# Patient Record
Sex: Female | Born: 1985 | Race: Black or African American | Hispanic: No | Marital: Married | Smoking: Former smoker
Health system: Southern US, Community
[De-identification: ages and names within clinical notes are randomized; demographics above are authoritative.]

## PROBLEM LIST (undated history)

## (undated) ENCOUNTER — Inpatient Hospital Stay (HOSPITAL_COMMUNITY): Payer: Self-pay

## (undated) DIAGNOSIS — E669 Obesity, unspecified: Secondary | ICD-10-CM

## (undated) DIAGNOSIS — O139 Gestational [pregnancy-induced] hypertension without significant proteinuria, unspecified trimester: Secondary | ICD-10-CM

## (undated) DIAGNOSIS — J45909 Unspecified asthma, uncomplicated: Secondary | ICD-10-CM

## (undated) DIAGNOSIS — R51 Headache: Secondary | ICD-10-CM

## (undated) DIAGNOSIS — IMO0002 Reserved for concepts with insufficient information to code with codable children: Secondary | ICD-10-CM

## (undated) DIAGNOSIS — M549 Dorsalgia, unspecified: Secondary | ICD-10-CM

## (undated) DIAGNOSIS — B999 Unspecified infectious disease: Secondary | ICD-10-CM

## (undated) HISTORY — PX: TONSILLECTOMY: SUR1361

## (undated) HISTORY — PX: TONSILECTOMY, ADENOIDECTOMY, BILATERAL MYRINGOTOMY AND TUBES: SHX2538

## (undated) HISTORY — PX: WISDOM TOOTH EXTRACTION: SHX21

---

## 2006-02-04 ENCOUNTER — Emergency Department (HOSPITAL_COMMUNITY): Admission: EM | Admit: 2006-02-04 | Discharge: 2006-02-04 | Payer: Self-pay | Admitting: Emergency Medicine

## 2006-02-06 ENCOUNTER — Emergency Department (HOSPITAL_COMMUNITY): Admission: EM | Admit: 2006-02-06 | Discharge: 2006-02-06 | Payer: Self-pay | Admitting: Family Medicine

## 2006-08-20 ENCOUNTER — Inpatient Hospital Stay (HOSPITAL_COMMUNITY): Admission: AD | Admit: 2006-08-20 | Discharge: 2006-08-20 | Payer: Self-pay | Admitting: Obstetrics & Gynecology

## 2006-08-23 ENCOUNTER — Inpatient Hospital Stay (HOSPITAL_COMMUNITY): Admission: AD | Admit: 2006-08-23 | Discharge: 2006-08-23 | Payer: Self-pay | Admitting: Obstetrics & Gynecology

## 2006-09-03 ENCOUNTER — Inpatient Hospital Stay (HOSPITAL_COMMUNITY): Admission: AD | Admit: 2006-09-03 | Discharge: 2006-09-03 | Payer: Self-pay | Admitting: Obstetrics & Gynecology

## 2006-09-19 ENCOUNTER — Inpatient Hospital Stay (HOSPITAL_COMMUNITY): Admission: AD | Admit: 2006-09-19 | Discharge: 2006-09-19 | Payer: Self-pay | Admitting: Obstetrics & Gynecology

## 2006-10-14 ENCOUNTER — Inpatient Hospital Stay (HOSPITAL_COMMUNITY): Admission: AD | Admit: 2006-10-14 | Discharge: 2006-10-15 | Payer: Self-pay | Admitting: Obstetrics & Gynecology

## 2006-10-18 ENCOUNTER — Inpatient Hospital Stay (HOSPITAL_COMMUNITY): Admission: AD | Admit: 2006-10-18 | Discharge: 2006-10-18 | Payer: Self-pay | Admitting: Gynecology

## 2006-10-18 ENCOUNTER — Ambulatory Visit: Payer: Self-pay | Admitting: Gynecology

## 2006-10-20 ENCOUNTER — Ambulatory Visit: Payer: Self-pay | Admitting: Family Medicine

## 2006-10-26 ENCOUNTER — Ambulatory Visit (HOSPITAL_COMMUNITY): Admission: RE | Admit: 2006-10-26 | Discharge: 2006-10-26 | Payer: Self-pay | Admitting: Family Medicine

## 2006-11-16 ENCOUNTER — Ambulatory Visit (HOSPITAL_COMMUNITY): Admission: RE | Admit: 2006-11-16 | Discharge: 2006-11-16 | Payer: Self-pay | Admitting: Family Medicine

## 2006-11-29 ENCOUNTER — Ambulatory Visit (HOSPITAL_COMMUNITY): Admission: RE | Admit: 2006-11-29 | Discharge: 2006-11-29 | Payer: Self-pay | Admitting: Family Medicine

## 2006-12-01 ENCOUNTER — Emergency Department (HOSPITAL_COMMUNITY): Admission: EM | Admit: 2006-12-01 | Discharge: 2006-12-02 | Payer: Self-pay | Admitting: Emergency Medicine

## 2007-01-23 ENCOUNTER — Ambulatory Visit: Payer: Self-pay | Admitting: Obstetrics and Gynecology

## 2007-01-23 ENCOUNTER — Inpatient Hospital Stay (HOSPITAL_COMMUNITY): Admission: AD | Admit: 2007-01-23 | Discharge: 2007-01-23 | Payer: Self-pay | Admitting: Family Medicine

## 2007-02-05 ENCOUNTER — Ambulatory Visit (HOSPITAL_COMMUNITY): Admission: RE | Admit: 2007-02-05 | Discharge: 2007-02-05 | Payer: Self-pay | Admitting: Family Medicine

## 2007-02-26 ENCOUNTER — Ambulatory Visit: Payer: Self-pay | Admitting: Physician Assistant

## 2007-02-26 ENCOUNTER — Inpatient Hospital Stay (HOSPITAL_COMMUNITY): Admission: AD | Admit: 2007-02-26 | Discharge: 2007-02-26 | Payer: Self-pay | Admitting: Obstetrics & Gynecology

## 2007-03-12 ENCOUNTER — Ambulatory Visit (HOSPITAL_COMMUNITY): Admission: RE | Admit: 2007-03-12 | Discharge: 2007-03-12 | Payer: Self-pay | Admitting: Obstetrics & Gynecology

## 2007-03-15 ENCOUNTER — Inpatient Hospital Stay (HOSPITAL_COMMUNITY): Admission: AD | Admit: 2007-03-15 | Discharge: 2007-03-15 | Payer: Self-pay | Admitting: Obstetrics & Gynecology

## 2007-03-15 ENCOUNTER — Encounter: Payer: Self-pay | Admitting: Emergency Medicine

## 2007-04-17 ENCOUNTER — Inpatient Hospital Stay (HOSPITAL_COMMUNITY): Admission: AD | Admit: 2007-04-17 | Discharge: 2007-04-17 | Payer: Self-pay | Admitting: Obstetrics & Gynecology

## 2007-04-23 ENCOUNTER — Inpatient Hospital Stay (HOSPITAL_COMMUNITY): Admission: AD | Admit: 2007-04-23 | Discharge: 2007-04-23 | Payer: Self-pay | Admitting: Family Medicine

## 2007-04-23 ENCOUNTER — Ambulatory Visit: Payer: Self-pay | Admitting: Obstetrics and Gynecology

## 2007-04-28 ENCOUNTER — Inpatient Hospital Stay (HOSPITAL_COMMUNITY): Admission: AD | Admit: 2007-04-28 | Discharge: 2007-04-28 | Payer: Self-pay | Admitting: Obstetrics & Gynecology

## 2007-04-28 ENCOUNTER — Ambulatory Visit: Payer: Self-pay | Admitting: Obstetrics and Gynecology

## 2007-04-28 ENCOUNTER — Inpatient Hospital Stay (HOSPITAL_COMMUNITY): Admission: AD | Admit: 2007-04-28 | Discharge: 2007-04-29 | Payer: Self-pay | Admitting: Obstetrics & Gynecology

## 2007-04-29 ENCOUNTER — Inpatient Hospital Stay (HOSPITAL_COMMUNITY): Admission: AD | Admit: 2007-04-29 | Discharge: 2007-05-01 | Payer: Self-pay | Admitting: Obstetrics & Gynecology

## 2007-04-29 ENCOUNTER — Ambulatory Visit: Payer: Self-pay | Admitting: Obstetrics and Gynecology

## 2007-09-14 ENCOUNTER — Emergency Department (HOSPITAL_COMMUNITY): Admission: EM | Admit: 2007-09-14 | Discharge: 2007-09-14 | Payer: Self-pay | Admitting: Emergency Medicine

## 2008-01-10 ENCOUNTER — Ambulatory Visit (HOSPITAL_COMMUNITY): Admission: RE | Admit: 2008-01-10 | Discharge: 2008-01-10 | Payer: Self-pay | Admitting: Obstetrics & Gynecology

## 2008-01-28 ENCOUNTER — Ambulatory Visit (HOSPITAL_COMMUNITY): Admission: RE | Admit: 2008-01-28 | Discharge: 2008-01-28 | Payer: Self-pay | Admitting: Family Medicine

## 2008-02-11 ENCOUNTER — Inpatient Hospital Stay (HOSPITAL_COMMUNITY): Admission: AD | Admit: 2008-02-11 | Discharge: 2008-02-11 | Payer: Self-pay | Admitting: Family Medicine

## 2008-02-25 ENCOUNTER — Ambulatory Visit (HOSPITAL_COMMUNITY): Admission: RE | Admit: 2008-02-25 | Discharge: 2008-02-25 | Payer: Self-pay | Admitting: Family Medicine

## 2008-03-11 ENCOUNTER — Ambulatory Visit (HOSPITAL_COMMUNITY): Admission: RE | Admit: 2008-03-11 | Discharge: 2008-03-11 | Payer: Self-pay | Admitting: Family Medicine

## 2008-04-07 ENCOUNTER — Ambulatory Visit (HOSPITAL_COMMUNITY): Admission: RE | Admit: 2008-04-07 | Discharge: 2008-04-07 | Payer: Self-pay | Admitting: Family Medicine

## 2008-05-06 ENCOUNTER — Ambulatory Visit (HOSPITAL_COMMUNITY): Admission: RE | Admit: 2008-05-06 | Discharge: 2008-05-06 | Payer: Self-pay | Admitting: Family Medicine

## 2008-05-28 ENCOUNTER — Inpatient Hospital Stay (HOSPITAL_COMMUNITY): Admission: AD | Admit: 2008-05-28 | Discharge: 2008-05-28 | Payer: Self-pay | Admitting: Obstetrics & Gynecology

## 2008-05-28 ENCOUNTER — Ambulatory Visit: Payer: Self-pay | Admitting: Family Medicine

## 2008-06-17 ENCOUNTER — Ambulatory Visit (HOSPITAL_COMMUNITY): Admission: RE | Admit: 2008-06-17 | Discharge: 2008-06-17 | Payer: Self-pay | Admitting: Family Medicine

## 2008-07-17 ENCOUNTER — Ambulatory Visit (HOSPITAL_COMMUNITY): Admission: RE | Admit: 2008-07-17 | Discharge: 2008-07-17 | Payer: Self-pay | Admitting: Family Medicine

## 2008-07-24 ENCOUNTER — Inpatient Hospital Stay (HOSPITAL_COMMUNITY): Admission: AD | Admit: 2008-07-24 | Discharge: 2008-07-25 | Payer: Self-pay | Admitting: Obstetrics & Gynecology

## 2008-07-29 ENCOUNTER — Inpatient Hospital Stay (HOSPITAL_COMMUNITY): Admission: AD | Admit: 2008-07-29 | Discharge: 2008-07-30 | Payer: Self-pay | Admitting: Obstetrics & Gynecology

## 2008-08-06 ENCOUNTER — Inpatient Hospital Stay (HOSPITAL_COMMUNITY): Admission: AD | Admit: 2008-08-06 | Discharge: 2008-08-07 | Payer: Self-pay | Admitting: Obstetrics and Gynecology

## 2008-08-07 ENCOUNTER — Inpatient Hospital Stay (HOSPITAL_COMMUNITY): Admission: AD | Admit: 2008-08-07 | Discharge: 2008-08-09 | Payer: Self-pay | Admitting: Obstetrics and Gynecology

## 2008-08-07 ENCOUNTER — Ambulatory Visit: Payer: Self-pay | Admitting: Family

## 2008-09-29 ENCOUNTER — Emergency Department (HOSPITAL_COMMUNITY): Admission: EM | Admit: 2008-09-29 | Discharge: 2008-09-29 | Payer: Self-pay | Admitting: Family Medicine

## 2009-02-03 ENCOUNTER — Emergency Department (HOSPITAL_COMMUNITY): Admission: EM | Admit: 2009-02-03 | Discharge: 2009-02-03 | Payer: Self-pay | Admitting: Emergency Medicine

## 2010-07-29 ENCOUNTER — Emergency Department (HOSPITAL_COMMUNITY)
Admission: EM | Admit: 2010-07-29 | Discharge: 2010-07-29 | Payer: Self-pay | Source: Home / Self Care | Admitting: Emergency Medicine

## 2010-10-09 LAB — URINALYSIS, ROUTINE W REFLEX MICROSCOPIC
Bilirubin Urine: NEGATIVE
Glucose, UA: NEGATIVE mg/dL
Protein, ur: NEGATIVE mg/dL
pH: 6 (ref 5.0–8.0)

## 2010-10-09 LAB — WET PREP, GENITAL
Clue Cells Wet Prep HPF POC: NONE SEEN
Trich, Wet Prep: NONE SEEN

## 2010-10-09 LAB — URINE MICROSCOPIC-ADD ON

## 2010-10-09 LAB — GC/CHLAMYDIA PROBE AMP, GENITAL
Chlamydia, DNA Probe: NEGATIVE
GC Probe Amp, Genital: NEGATIVE

## 2010-10-18 LAB — CBC
HCT: 30.6 % — ABNORMAL LOW (ref 36.0–46.0)
HCT: 31.2 % — ABNORMAL LOW (ref 36.0–46.0)
Hemoglobin: 10.4 g/dL — ABNORMAL LOW (ref 12.0–15.0)
Hemoglobin: 11 g/dL — ABNORMAL LOW (ref 12.0–15.0)
Hemoglobin: 11.7 g/dL — ABNORMAL LOW (ref 12.0–15.0)
MCHC: 33.8 g/dL (ref 30.0–36.0)
MCHC: 34 g/dL (ref 30.0–36.0)
MCV: 89.4 fL (ref 78.0–100.0)
RBC: 3.5 MIL/uL — ABNORMAL LOW (ref 3.87–5.11)
RDW: 12.8 % (ref 11.5–15.5)
RDW: 12.9 % (ref 11.5–15.5)
RDW: 13.1 % (ref 11.5–15.5)

## 2010-10-18 LAB — COMPREHENSIVE METABOLIC PANEL
BUN: 1 mg/dL — ABNORMAL LOW (ref 6–23)
CO2: 23 mEq/L (ref 19–32)
Calcium: 7.9 mg/dL — ABNORMAL LOW (ref 8.4–10.5)
Creatinine, Ser: 0.5 mg/dL (ref 0.4–1.2)
GFR calc non Af Amer: >60 mL/min (ref >60)
Glucose, Bld: 96 mg/dL (ref 70–99)
Total Protein: 5.4 g/dL — ABNORMAL LOW (ref 6.0–8.3)

## 2010-10-18 LAB — CREATININE CLEARANCE, URINE, 24 HOUR
Collection Interval-CRCL: 24 hours
Creatinine Clearance: 207 mL/min — ABNORMAL HIGH (ref 75–115)
Creatinine, 24H Ur: 1463 mg/d (ref 700–1800)
Creatinine, Urine: 150.8 mg/dL
Urine Total Volume-CRCL: 970 mL

## 2010-10-18 LAB — DIFFERENTIAL
Basophils Absolute: 0 10*3/uL (ref 0.0–0.1)
Basophils Relative: 1 % (ref 0–1)
Eosinophils Absolute: 0 10*3/uL (ref 0.0–0.7)
Monocytes Absolute: 0.4 10*3/uL (ref 0.1–1.0)
Neutro Abs: 7 10*3/uL (ref 1.7–7.7)
Neutrophils Relative %: 86 % — ABNORMAL HIGH (ref 43–77)

## 2010-10-18 LAB — BASIC METABOLIC PANEL
CO2: 23 mEq/L (ref 19–32)
Chloride: 105 mEq/L (ref 96–112)
GFR calc Af Amer: >60 mL/min (ref >60)
Sodium: 135 mEq/L (ref 135–145)

## 2010-10-18 LAB — STREP A DNA PROBE: Group A Strep Probe: NEGATIVE

## 2010-10-18 LAB — PROTEIN, URINE, 24 HOUR: Protein, 24H Urine: 136 mg/d — ABNORMAL HIGH (ref 50–100)

## 2010-10-18 LAB — INFLUENZA A+B VIRUS AG-DIRECT(RAPID): Inflenza A Ag: NEGATIVE

## 2010-10-19 LAB — CBC
HCT: 30.5 % — ABNORMAL LOW (ref 36.0–46.0)
HCT: 34.3 % — ABNORMAL LOW (ref 36.0–46.0)
Hemoglobin: 11.7 g/dL — ABNORMAL LOW (ref 12.0–15.0)
MCHC: 34 g/dL (ref 30.0–36.0)
MCHC: 34.1 g/dL (ref 30.0–36.0)
MCV: 90.7 fL (ref 78.0–100.0)
RBC: 3.36 MIL/uL — ABNORMAL LOW (ref 3.87–5.11)
RBC: 3.82 MIL/uL — ABNORMAL LOW (ref 3.87–5.11)
RDW: 13.2 % (ref 11.5–15.5)
WBC: 7.2 10*3/uL (ref 4.0–10.5)

## 2010-11-16 NOTE — Discharge Summary (Signed)
NAMEDONNISHA, [name] NO.:  [id number]   MEDICAL RECORD NO.:  [id number]          PATIENT TYPE:  INP   LOCATION:  9156                          FACILITY:  [facility]   PHYSICIAN:  Tilda Burrow, M.D. DATE OF BIRTH:  02/23/1986   DATE OF ADMISSION:  07/29/2008  DATE OF DISCHARGE:  07/30/2008                               DISCHARGE SUMMARY   REASON FOR HOSPITALIZATION:  The patient was admitted for vomiting x2  days on July 29, 2008 and inability to eat, tolerate p.o.   PERTINENT LABORATORY DATA:  Potassium level on July 29, 2008 was 2.7,  today on the July 30, 2008, it is 2.9.  Hemoglobin at admit was 10.4.   FINAL DIAGNOSIS:  Viral illness with nausea and vomiting.   SIGNIFICANT FINDINGS:  A 24-hour urine; protein was 136, creatinine was  1.463.  The patient had elevated blood pressures on July 30, 2008.  The highest BP was 140/90.  The patient denied any PIH symptoms.   PROCEDURES PERFORMED AND TREATMENT RENDERED:  The patient was given 6  runs of potassium IV and IV fluids for rehydration   CONDITION OF THE PATIENT ON DISCHARGE:  The patient is alert and  oriented x3.  No PIH symptoms.  Fetal heart rate 120s, positive accels,  reactive strip.  The patient not vomiting x24 hours and tolerating p.o.  food and fluid.   PHYSICAL EXAMINATION:  VITAL SIGNS:  On discharge, blood pressure  137/89, pulse 86, respirations 20, and temperature 97.4.  EXTREMITIES:  Trace bilateral pedal edema __________.  LUNGS:  Clear to auscultation.  HEART:  Regular without murmurs, gallops, or rubs.  ABDOMEN:  Positive bowel sounds x4.   DISCHARGE INSTRUCTIONS:  The patient is to follow up at the health  department on the 1st for standing prenatal appointment, was to have the  potassium level rechecked at that appointment.  Prescription given today  for K-Dur 20 mEq 1 p.o. b.i.d.  We will continue until lab result is  obtained at prenatal visit.  The patient is to report  any PIH symptoms  and is to follow up for labor signs as well.      Sid Falcon, CNM      Tilda Burrow, M.D.  Electronically Signed    WM/MEDQ  D:  07/30/2008  T:  07/31/2008  Job:  [id number]

## 2010-11-16 NOTE — Discharge Summary (Signed)
NAMEJESSINA, [name] NO.:  [id number]   MEDICAL RECORD NO.:  [id number]          PATIENT TYPE:  INP   LOCATION:  9129                          [facility]ACILITY:  [facility]   PHYSICIAN:  Scheryl Darter, MD       DATE OF BIRTH:  Apr 22, 1986   DATE OF ADMISSION:  07/24/2008  DATE OF DISCHARGE:  07/25/2008                               DISCHARGE SUMMARY   PRENATAL CARE [name]:  [facility] Department.   DISCHARGE DIAGNOSIS:  Viral upper respiratory tract  infection/pharyngitis.   DISCHARGE MEDICATIONS:  1. Magic mouth wash 5 mL swish and swallow q.4-6 h as needed for pain.  2. Tamiflu 75 mg b.i.d. x5 days.  3. Tylenol 325 mg q.4 h p.r.n. pain.  4. Salt water gargle t.i.d. p.r.n., sore throat.  5. Prenatal vitamins 1 tablet daily.   PROCEDURE:  Chest x-ray shows no active cardiopulmonary disease.   LABORATORY DATA:  White count 8, hemoglobin 11, hematocrit 31.2,  platelets 148.  Rapid step screen negative.  Rapid influenza pending,  group A strep pending.   BRIEF HOSPITAL COURSE:  In brief, this is a 25 year old G3 P2 at 37  weeks presenting to the MAU with sore throat and nonproductive cough.  She was admitted for observation and evaluation with labs.  The patient  did not have any shortness of breath or no documented fevers throughout  her hospital course, and plan to discharge her today with treatment for  Tamiflu given increased risk in pregnant patient.  The patient does not  have any indication for treatment with antibiotics.  We will also  discharge her with symptomatic treatment for her sore throat.   DISCHARGE INSTRUCTIONS:  The patient instructed to return for further  evaluation if she runs a persistent fever, was unable to tolerate p.o.  liquid, or for other concerns.  The patient instructed to keep all  followup appointments at the [facility] Department.   DISCHARGE CONDITION:  The patient discharged in stable condition to  home.      Delbert Harness,  MD  Electronically Signed     ______________________________  Scheryl Darter, MD   KB/MEDQ  D:  07/25/2008  T:  07/26/2008  Job:  [id number]

## 2011-04-01 LAB — URINALYSIS, ROUTINE W REFLEX MICROSCOPIC
Bilirubin Urine: NEGATIVE
Glucose, UA: NEGATIVE
Ketones, ur: NEGATIVE
Leukocytes, UA: NEGATIVE
Protein, ur: NEGATIVE
pH: 6

## 2011-04-01 LAB — URINE MICROSCOPIC-ADD ON

## 2011-04-05 LAB — URINE MICROSCOPIC-ADD ON

## 2011-04-05 LAB — URINALYSIS, ROUTINE W REFLEX MICROSCOPIC
Glucose, UA: NEGATIVE
Nitrite: NEGATIVE
Specific Gravity, Urine: 1.015
pH: 6.5

## 2011-04-13 LAB — CBC
HCT: 36.7
Hemoglobin: 10.5 — ABNORMAL LOW
MCHC: 34.8
Platelets: 210
RBC: 3.35 — ABNORMAL LOW
RDW: 12.6
WBC: 6.8

## 2011-04-13 LAB — RPR: RPR Ser Ql: NONREACTIVE

## 2011-04-15 LAB — URINALYSIS, ROUTINE W REFLEX MICROSCOPIC
Glucose, UA: NEGATIVE
Glucose, UA: NEGATIVE
Hgb urine dipstick: NEGATIVE
Hgb urine dipstick: NEGATIVE
Specific Gravity, Urine: 1.01
Urobilinogen, UA: 0.2
pH: 6.5
pH: 7

## 2011-04-15 LAB — URINE MICROSCOPIC-ADD ON

## 2011-04-15 LAB — POCT I-STAT CREATININE
Creatinine, Ser: 0.8
Operator id: 279831

## 2011-04-15 LAB — ABO/RH: ABO/RH(D): AB POS

## 2011-04-15 LAB — I-STAT 8, (EC8 V) (CONVERTED LAB)
Acid-base deficit: 2
Bicarbonate: 21.1
HCT: 34 — ABNORMAL LOW
Operator id: 279831
TCO2: 22
pCO2, Ven: 30.9 — ABNORMAL LOW
pH, Ven: 7.442 — ABNORMAL HIGH

## 2011-04-18 LAB — URINALYSIS, ROUTINE W REFLEX MICROSCOPIC
Hgb urine dipstick: NEGATIVE
Nitrite: NEGATIVE
Protein, ur: NEGATIVE
Urobilinogen, UA: 0.2

## 2011-04-18 LAB — URINE MICROSCOPIC-ADD ON

## 2011-04-18 LAB — WET PREP, GENITAL
Trich, Wet Prep: NONE SEEN
Yeast Wet Prep HPF POC: NONE SEEN

## 2011-11-12 ENCOUNTER — Emergency Department (HOSPITAL_COMMUNITY)
Admission: EM | Admit: 2011-11-12 | Discharge: 2011-11-13 | Disposition: A | Discharge status: Home / Self Care | Payer: Self-pay | Attending: Emergency Medicine | Admitting: Emergency Medicine

## 2011-11-12 ENCOUNTER — Encounter (HOSPITAL_COMMUNITY): Payer: Self-pay | Admitting: *Deleted

## 2011-11-12 DIAGNOSIS — J329 Chronic sinusitis, unspecified: Secondary | ICD-10-CM | POA: Insufficient documentation

## 2011-11-12 DIAGNOSIS — J029 Acute pharyngitis, unspecified: Secondary | ICD-10-CM | POA: Insufficient documentation

## 2011-11-12 HISTORY — DX: Obesity, unspecified: E66.9

## 2011-11-12 MED ORDER — DEXAMETHASONE SODIUM PHOSPHATE 10 MG/ML IJ SOLN
10.0000 mg | Freq: Once | INTRAMUSCULAR | Status: AC
  Administered 2011-11-12: 10 mg via INTRAMUSCULAR
  Filled 2011-11-12 (×2): qty 1

## 2011-11-12 NOTE — ED Notes (Signed)
Pt states sore throat for 2 days. Pt also stuffy and having sinus pressure. Pt states painful to swallow. Pt able to keep fluids down.

## 2011-11-12 NOTE — ED Provider Notes (Signed)
History     CSN: [id number]  Arrival date & time 11/12/11  2219   First MD Initiated Contact with Patient 11/12/11 2251      Chief Complaint  Patient presents with  . Sore Throat    (Consider location/radiation/quality/duration/timing/severity/associated sxs/prior treatment) HPI Comments: Patient here with sore throat and sinus congestion - states husband with similar symptoms - reports no fever or chills, headache, cough or congestion - states sinus pressure and runny nose as well - is concerned that she may have strep throat.  Patient is a 26 y.o. female presenting with pharyngitis. The history is provided by the patient. No language interpreter was used.  Sore Throat This is a new problem. The current episode started yesterday. The problem occurs constantly. The problem has been unchanged. Associated symptoms include congestion and a sore throat. Pertinent negatives include no abdominal pain, anorexia, arthralgias, change in bowel habit, chest pain, chills, coughing, diaphoresis, fatigue, fever, headaches, joint swelling, myalgias, nausea, neck pain, numbness, rash, swollen glands, urinary symptoms, vertigo, visual change, vomiting or weakness. The symptoms are aggravated by swallowing. She has tried nothing for the symptoms. The treatment provided no relief.    Past Medical History  Diagnosis Date  . Obesity     Past Surgical History  Procedure Date  . Tonsilectomy, adenoidectomy, bilateral myringotomy and tubes     History reviewed. No pertinent family history.  History  Substance Use Topics  . Smoking status: Never Smoker   . Smokeless tobacco: Not on file  . Alcohol Use: No    OB History    Grav Para Term Preterm Abortions TAB SAB Ect Mult Living                  Review of Systems  Constitutional: Negative for fever, chills, diaphoresis and fatigue.  HENT: Positive for congestion and sore throat. Negative for neck pain.   Respiratory: Negative for cough.     Cardiovascular: Negative for chest pain.  Gastrointestinal: Negative for nausea, vomiting, abdominal pain, anorexia and change in bowel habit.  Musculoskeletal: Negative for myalgias, joint swelling and arthralgias.  Skin: Negative for rash.  Neurological: Negative for vertigo, weakness, numbness and headaches.  All other systems reviewed and are negative.    Allergies  Iodine and Shellfish allergy  Home Medications   Current Outpatient Rx  Name Route Sig Dispense Refill  . MENTHOL 3 MG MT LOZG Oral Take 1 lozenge by mouth every 3 (three) hours as needed. For sore throat    . ALKA-SELTZER PLUS COLD/SINUS PO Oral Take 1 tablet by mouth every 6 (six) hours as needed. For cold symptoms      BP 145/87  Pulse 100  Temp(Src) 97.8 F (36.6 C) (Oral)  Resp 16  SpO2 98%  Physical Exam  Nursing note and vitals reviewed. Constitutional: She is oriented to person, place, and time. She appears well-developed and well-nourished. No distress.  HENT:  Head: Normocephalic and atraumatic.  Right Ear: External ear normal.  Left Ear: External ear normal.  Nose: Right sinus exhibits maxillary sinus tenderness. Left sinus exhibits maxillary sinus tenderness.  Mouth/Throat: Oropharynx is clear and moist. No oropharyngeal exudate.  Eyes: Conjunctivae are normal. Pupils are equal, round, and reactive to light. No scleral icterus.  Neck: Normal range of motion. Neck supple.  Cardiovascular: Normal rate, regular rhythm and normal heart sounds.  Exam reveals no gallop and no friction rub.   No murmur heard. Pulmonary/Chest: Breath sounds normal. No respiratory distress. She has no  wheezes.  Abdominal: Soft. Bowel sounds are normal. She exhibits no distension. There is no tenderness.  Musculoskeletal: Normal range of motion. She exhibits no edema and no tenderness.  Lymphadenopathy:    She has no cervical adenopathy.  Neurological: She is alert and oriented to person, place, and time. No cranial  nerve deficit.  Skin: Skin is warm and dry. No rash noted. No erythema. No pallor.  Psychiatric: She has a normal mood and affect. Her behavior is normal. Judgment and thought content normal.    ED Course  Procedures (including critical care time)   Labs Reviewed  RAPID STREP SCREEN   No results found. Results for orders placed during the hospital encounter of 11/12/11  RAPID STREP SCREEN      Component Value Range   Streptococcus, Group A Screen (Direct) NEGATIVE  NEGATIVE    No results found.    Sinusitis     MDM  Strep negative and clinically patient with symptoms more c/w sinusitis than strep - will treat as such with decongestants - given injfection of decadron for this as well.        Izola Price [place], [place] 11/12/11 2346

## 2011-11-12 NOTE — ED Notes (Signed)
Patient with c/o sore throat for a few days.  Patient also c/o cough, nasal drainage, and cough.

## 2011-11-12 NOTE — ED Provider Notes (Signed)
Medical screening examination/treatment/procedure(s) were performed by non-physician practitioner and as supervising physician I was immediately available for consultation/collaboration.   Lyanne Co, MD 11/12/11 [phone]

## 2011-11-12 NOTE — Discharge Instructions (Signed)
Sinusitis Sinuses are air pockets within the bones of your face. The growth of bacteria within a sinus leads to infection. The infection prevents the sinuses from draining. This infection is called sinusitis. SYMPTOMS  There will be different areas of pain depending on which sinuses have become infected.  The maxillary sinuses often produce pain beneath the eyes.   Frontal sinusitis may cause pain in the middle of the forehead and above the eyes.  Other problems (symptoms) include:  Toothaches.   Colored, pus-like (purulent) drainage from the nose.   Swelling, warmth, and tenderness over the sinus areas may be signs of infection.  TREATMENT  Sinusitis is most often determined by an exam.X-rays may be taken. If x-rays have been taken, make sure you obtain your results or find out how you are to obtain them. Your caregiver may give you medications (antibiotics). These are medications that will help kill the bacteria causing the infection. You may also be given a medication (decongestant) that helps to reduce sinus swelling.  HOME CARE INSTRUCTIONS   Only take over-the-counter or prescription medicines for pain, discomfort, or fever as directed by your caregiver.   Drink extra fluids. Fluids help thin the mucus so your sinuses can drain more easily.   Applying either moist heat or ice packs to the sinus areas may help relieve discomfort.   Use saline nasal sprays to help moisten your sinuses. The sprays can be found at your local drugstore.  SEEK IMMEDIATE MEDICAL CARE IF:  You have a fever.   You have increasing pain, severe headaches, or toothache.   You have nausea, vomiting, or drowsiness.   You develop unusual swelling around the face or trouble seeing.  MAKE SURE YOU:   Understand these instructions.   Will watch your condition.   Will get help right away if you are not doing well or get worse.  Document Released: 06/20/2005 Document Revised: 06/09/2011 Document Reviewed:  01/17/2007 [facility] Patient Information 2012 [place], [place].Sinusitis Sinusitis an infection of the air pockets (sinuses) in your face. This can cause puffiness (swelling). It can also cause drainage from your sinuses.  HOME CARE   Only take medicine as told by your doctor.   Drink enough fluids to keep your pee (urine) clear or pale yellow.   Apply moist heat or ice packs for pain relief.   Use salt (saline) nose sprays. The spray will wet the thick fluid in the nose. This can help the sinuses drain.  GET HELP RIGHT AWAY IF:   You have a fever.   Your baby is older than 3 months with a rectal temperature of 102 F (38.9 C) or higher.   Your baby is 69 months old or younger with a rectal temperature of 100.4 F (38 C) or higher.   The pain gets worse.   You get a very bad headache.   You keep throwing up (vomiting).   Your face gets puffy.  MAKE SURE YOU:   Understand these instructions.   Will watch your condition.   Will get help right away if you are not doing well or get worse.  Document Released: 12/07/2007 Document Revised: 06/09/2011 Document Reviewed: 12/07/2007 [facility] Patient Information 2012 [place], [place].

## 2012-01-16 ENCOUNTER — Encounter (HOSPITAL_COMMUNITY): Payer: Self-pay | Admitting: Emergency Medicine

## 2012-01-16 ENCOUNTER — Emergency Department (HOSPITAL_COMMUNITY)
Admission: EM | Admit: 2012-01-16 | Discharge: 2012-01-16 | Disposition: A | Discharge status: Home / Self Care | Payer: Self-pay | Attending: Emergency Medicine | Admitting: Emergency Medicine

## 2012-01-16 DIAGNOSIS — B9689 Other specified bacterial agents as the cause of diseases classified elsewhere: Secondary | ICD-10-CM | POA: Insufficient documentation

## 2012-01-16 DIAGNOSIS — A499 Bacterial infection, unspecified: Secondary | ICD-10-CM | POA: Insufficient documentation

## 2012-01-16 DIAGNOSIS — N760 Acute vaginitis: Secondary | ICD-10-CM | POA: Insufficient documentation

## 2012-01-16 LAB — URINALYSIS, ROUTINE W REFLEX MICROSCOPIC
Glucose, UA: NEGATIVE mg/dL
Hgb urine dipstick: NEGATIVE
Specific Gravity, Urine: 1.008 (ref 1.005–1.030)
pH: 6 (ref 5.0–8.0)

## 2012-01-16 LAB — GC/CHLAMYDIA PROBE AMP, GENITAL: Chlamydia, DNA Probe: NEGATIVE

## 2012-01-16 LAB — URINE MICROSCOPIC-ADD ON

## 2012-01-16 LAB — POCT PREGNANCY, URINE: Preg Test, Ur: NEGATIVE

## 2012-01-16 LAB — PREGNANCY, URINE: Preg Test, Ur: NEGATIVE

## 2012-01-16 MED ORDER — METRONIDAZOLE 500 MG PO TABS: 500.0000 mg | ORAL_TABLET | Freq: Two times a day (BID) | ORAL | Status: AC

## 2012-01-16 MED ORDER — EPINEPHRINE 0.3 MG/0.3ML IJ DEVI: 0.3000 mg | Freq: Once | INTRAMUSCULAR | Status: DC

## 2012-01-16 NOTE — ED Provider Notes (Signed)
History     CSN: [id number]  Arrival date & time 01/16/12  0210   First MD Initiated Contact with [name] 01/16/12 0231      Chief Complaint  [name] presents with  . Dysuria    (Consider location/radiation/quality/duration/timing/severity/associated sxs/prior treatment) HPI Comments: [name] is a 26 yo female who presents with dysuria, frequency, and suprapubic pain of 8 hours duration. Symptoms began this evening, [name] says when [name] urinates it feels like pins stabbing her. [name] feels the urge to urinate frequently. [name] has noted a foul odor. No fever, chills, flank pain, hematuria. Pt is sexually active with husband, both reportedly monogamous, they do not use condoms or other birth control measures. No vaginal discharge.  Pt has a history of 'cervicitis' treated with a shot and pills.  Last PAP was normal    [name] is a 26 y.o. female presenting with dysuria.  Dysuria  Associated symptoms include frequency. Pertinent negatives include no chills, no nausea, no vomiting, no hematuria and no flank pain.  Dysuria  Associated symptoms include frequency. Pertinent negatives include no chills, flank pain, hematuria, nausea or vomiting.    Past Medical History  Diagnosis Date  . Obesity     Past Surgical History  Procedure Date  . Tonsilectomy, adenoidectomy, bilateral myringotomy and tubes     History reviewed. No pertinent family history.  History  Substance Use Topics  . Smoking status: Never Smoker   . Smokeless tobacco: Not on file  . Alcohol Use: No    OB History    Grav Para Term Preterm Abortions TAB SAB Ect Mult Living                  Review of Systems  Constitutional: Negative for fever and chills.  Respiratory: Negative for chest tightness.   Gastrointestinal: Negative for nausea and vomiting.  Genitourinary: Positive for dysuria and frequency. Negative for hematuria, flank pain and vaginal discharge.  Skin: Negative for rash.  All other systems  reviewed and are negative.    Allergies  Iodine and Shellfish allergy  Home Medications   Current Outpatient Rx  Name Route Sig Dispense Refill  . EPINEPHRINE 0.3 MG/0.3ML IJ DEVI Intramuscular Inject 0.3 mLs (0.3 mg total) into the muscle once. 1 Device 0  . METRONIDAZOLE 500 MG PO TABS Oral Take 1 tablet (500 mg total) by mouth 2 (two) times daily. 14 tablet 0    BP 117/85  Pulse 89  Temp 97.1 F (36.2 C)  Resp 16  SpO2 100%  Physical Exam  Constitutional: [name] is oriented to person, place, and time. [name] appears well-developed and well-nourished.  Eyes: Pupils are equal, round, and reactive to light.  Cardiovascular: Normal rate and regular rhythm.   No murmur heard. Pulmonary/Chest: Effort normal and breath sounds normal. [name] has no rales.  Abdominal: Soft. Bowel sounds are normal. [name] exhibits no distension. There is tenderness in the suprapubic area. There is no CVA tenderness.  Genitourinary: No tenderness around the vagina. Vaginal discharge found.       Small amount of grey-white discharge, faint fishy odor. No CMT. No external lesions.  Neurological: [name] is alert and oriented to person, place, and time.  Skin: Skin is warm and dry.  Psychiatric: [name] has a normal mood and affect.    ED Course  Procedures (including critical care time)  Labs Reviewed  URINALYSIS, ROUTINE W REFLEX MICROSCOPIC - Abnormal; Notable for the following:    Leukocytes, UA TRACE (*)  All other components within normal limits  URINE MICROSCOPIC-ADD ON - Abnormal; Notable for the following:    Squamous Epithelial / LPF FEW (*)     All other components within normal limits  WET PREP, GENITAL - Abnormal; Notable for the following:    Clue Cells Wet Prep HPF POC FEW (*)     WBC, Wet Prep HPF POC FEW (*)     All other components within normal limits  PREGNANCY, URINE  POCT PREGNANCY, URINE  URINALYSIS, ROUTINE W REFLEX MICROSCOPIC  GC/CHLAMYDIA PROBE AMP, GENITAL   No results  found.   1. Bacterial vaginosis       MDM   Well-appearing 26 yo F with dysuria and frequency. Hypertensive, afebrile, not tachycardic. UA not suggestive of UTI. Urine pregnancy negative. Pelvic exam with small amount of grey-white discharge, clue cells seen on wet prep. Suspect BV, will treat with Metronidazole. Gon/Chlamydia swabs sent. Will f/u on results.  [name] instructed to return to ED or seek medical attention if symptoms worsen or if signs of systemic infection arise, such as fever, chills, nausea, vomiting, change in mental status. F/u with OB/Gyn.

## 2012-01-16 NOTE — ED Notes (Signed)
Pt c/o Frequent painful burning during voiding, with abdominal pain tender to palpation. Denies vaginal discharge

## 2012-01-16 NOTE — ED Notes (Signed)
[  pt reports sudden onset of frequent urination small amounts states hx of UTI  When  pregnant

## 2012-01-16 NOTE — ED Notes (Signed)
Pt states understanding of discharge instructions 

## 2012-01-17 NOTE — ED Provider Notes (Signed)
History     CSN: [id number]  Arrival date & time 01/16/12  0210   First MD Initiated Contact with Patient 01/16/12 0231      Chief Complaint  Patient presents with  . Dysuria    (Consider location/radiation/quality/duration/timing/severity/associated sxs/prior treatment) Patient is a 26 y.o. female presenting with dysuria. The history is provided by the patient. No language interpreter was used.  Dysuria  This is a recurrent problem. The current episode started 6 to 12 hours ago. The problem occurs every urination. The problem has not changed since onset.The quality of the pain is described as burning. The pain is moderate. There has been no fever. She is sexually active. There is no history of pyelonephritis. Pertinent negatives include no vomiting, no hematuria, no possible pregnancy and no flank pain. She has tried nothing for the symptoms. Her past medical history is significant for recurrent UTIs. Her past medical history does not include kidney stones.    Past Medical History  Diagnosis Date  . Obesity     Past Surgical History  Procedure Date  . Tonsilectomy, adenoidectomy, bilateral myringotomy and tubes     History reviewed. No pertinent family history.  History  Substance Use Topics  . Smoking status: Never Smoker   . Smokeless tobacco: Not on file  . Alcohol Use: No    OB History    Grav Para Term Preterm Abortions TAB SAB Ect Mult Living                  Review of Systems  Gastrointestinal: Negative for vomiting.  Genitourinary: Positive for dysuria. Negative for hematuria and flank pain.  All other systems reviewed and are negative.    Allergies  Iodine and Shellfish allergy  Home Medications   Current Outpatient Rx  Name Route Sig Dispense Refill  . EPINEPHRINE 0.3 MG/0.3ML IJ DEVI Intramuscular Inject 0.3 mLs (0.3 mg total) into the muscle once. 1 Device 0  . METRONIDAZOLE 500 MG PO TABS Oral Take 1 tablet (500 mg total) by mouth 2 (two)  times daily. 14 tablet 0    BP 117/85  Pulse 89  Temp 97.1 F (36.2 C)  Resp 16  SpO2 100%  Physical Exam  Constitutional: She is oriented to person, place, and time. She appears well-developed and well-nourished.  HENT:  Head: Normocephalic and atraumatic.  Eyes: EOM are normal. Pupils are equal, round, and reactive to light.  Neck: Normal range of motion. Neck supple.  Cardiovascular: Normal rate and regular rhythm.   Pulmonary/Chest: Effort normal and breath sounds normal.  Abdominal: Soft. Bowel sounds are normal. There is no tenderness. There is no rebound and no guarding.  Musculoskeletal: Normal range of motion.  Neurological: She is alert and oriented to person, place, and time.  Skin: Skin is warm and dry.  Psychiatric: She has a normal mood and affect.    ED Course  Procedures (including critical care time)  Labs Reviewed  URINALYSIS, ROUTINE W REFLEX MICROSCOPIC - Abnormal; Notable for the following:    Leukocytes, UA TRACE (*)     All other components within normal limits  URINE MICROSCOPIC-ADD ON - Abnormal; Notable for the following:    Squamous Epithelial / LPF FEW (*)     All other components within normal limits  WET PREP, GENITAL - Abnormal; Notable for the following:    Clue Cells Wet Prep HPF POC FEW (*)     WBC, Wet Prep HPF POC FEW (*)     All  other components within normal limits  PREGNANCY, URINE  POCT PREGNANCY, URINE  GC/CHLAMYDIA PROBE AMP, GENITAL  LAB REPORT - SCANNED  URINALYSIS, ROUTINE W REFLEX MICROSCOPIC   No results found.   1. Bacterial vaginosis       MDM  Seen and elevated with resident agree with plan, I personally chaperoned the pelvic will treat for BV        Tiquan Bouch K [place] Freyre-Rasch, MD 01/17/12 [phone]

## 2012-01-27 NOTE — ED Provider Notes (Signed)
I saw and evaluated the patient, reviewed the resident's note and I agree with the findings and plan.   See my complete note above  Hansini Clodfelter K Itsel Opfer-Rasch, MD 01/27/12 2311

## 2012-02-01 LAB — OB RESULTS CONSOLE RUBELLA ANTIBODY, IGM: Rubella: IMMUNE

## 2012-05-28 ENCOUNTER — Inpatient Hospital Stay (HOSPITAL_COMMUNITY)
Admission: AD | Admit: 2012-05-28 | Discharge: 2012-05-29 | Disposition: A | Discharge status: Home / Self Care | Payer: Medicaid Other | Source: Ambulatory Visit | Attending: Obstetrics & Gynecology | Admitting: Obstetrics & Gynecology

## 2012-05-28 ENCOUNTER — Encounter (HOSPITAL_COMMUNITY): Payer: Self-pay | Admitting: *Deleted

## 2012-05-28 DIAGNOSIS — N390 Urinary tract infection, site not specified: Secondary | ICD-10-CM | POA: Insufficient documentation

## 2012-05-28 DIAGNOSIS — R109 Unspecified abdominal pain: Secondary | ICD-10-CM | POA: Insufficient documentation

## 2012-05-28 DIAGNOSIS — O2340 Unspecified infection of urinary tract in pregnancy, unspecified trimester: Secondary | ICD-10-CM

## 2012-05-28 DIAGNOSIS — O2390 Unspecified genitourinary tract infection in pregnancy, unspecified trimester: Secondary | ICD-10-CM | POA: Insufficient documentation

## 2012-05-28 DIAGNOSIS — Z3490 Encounter for supervision of normal pregnancy, unspecified, unspecified trimester: Secondary | ICD-10-CM

## 2012-05-28 HISTORY — DX: Unspecified asthma, uncomplicated: J45.909

## 2012-05-28 LAB — URINALYSIS, ROUTINE W REFLEX MICROSCOPIC
Glucose, UA: NEGATIVE mg/dL
Nitrite: POSITIVE — AB
Protein, ur: NEGATIVE mg/dL
pH: 5.5 (ref 5.0–8.0)

## 2012-05-28 LAB — POCT PREGNANCY, URINE: Preg Test, Ur: POSITIVE — AB

## 2012-05-28 LAB — URINE MICROSCOPIC-ADD ON

## 2012-05-28 NOTE — MAU Provider Note (Signed)
History     CSN: [id number]  Arrival date and time: 05/28/12 2132   First Provider Initiated Contact with Patient 05/28/12 2359      Chief Complaint  Patient presents with  . Abdominal Pain   HPI [name] is a 26 y.o. female who presents to MAU with abdominal pain. The pain is located in the left lower abdomen.The pain started yesterday. The pain started gradual, comes and goes and has gotten worse today.  She describes the pain as a shooting pain.  She rates the pain as 7/10. LMP 04/10/12. Last pap smear less than one year ago and was abnormal and is to have follow up in Jan. Current sex partner x 6 years. No history of STI's. The history was provided by the patient.  OB History    Grav Para Term Preterm Abortions TAB SAB Ect Mult Living   4 3 3       3       Past Medical History  Diagnosis Date  . Obesity   . Asthma     Past Surgical History  Procedure Date  . Tonsilectomy, adenoidectomy, bilateral myringotomy and tubes     History reviewed. No pertinent family history.  History  Substance Use Topics  . Smoking status: Never Smoker   . Smokeless tobacco: Not on file  . Alcohol Use: No    Allergies:  Allergies  Allergen Reactions  . Iodine Anaphylaxis  . Shellfish Allergy Anaphylaxis    Prescriptions prior to admission  Medication Sig Dispense Refill  . EPINEPHrine (EPI-PEN) 0.3 mg/0.3 mL DEVI Inject 0.3 mLs (0.3 mg total) into the muscle once.  1 Device  0    Review of Systems  Constitutional: Negative for fever, chills and weight loss.  HENT: Negative for ear pain, nosebleeds, congestion, sore throat and neck pain.   Eyes: Negative for blurred vision, double vision, photophobia and pain.  Respiratory: Negative for cough, shortness of breath and wheezing.   Cardiovascular: Negative for chest pain, palpitations and leg swelling.  Gastrointestinal: Positive for abdominal pain. Negative for heartburn, nausea, vomiting, diarrhea and constipation.    Genitourinary: Positive for urgency and frequency. Negative for dysuria.       Pressure with urination.  Musculoskeletal: Positive for back pain. Negative for myalgias.  Skin: Negative for itching and rash.  Neurological: Negative for dizziness, sensory change, speech change, seizures, weakness and headaches.  Endo/Heme/Allergies: Does not bruise/bleed easily.  Psychiatric/Behavioral: Negative for depression and substance abuse. The patient is not nervous/anxious and does not have insomnia.    Physical Exam   Blood pressure 139/82, pulse 91, temperature 97.9 F (36.6 C), temperature source Oral, resp. rate 18, height 5\' 4"  (1.626 m), weight 278 lb 12.8 oz (126.463 kg), last menstrual period 04/10/2012.  Physical Exam  Nursing note and vitals reviewed. Constitutional: She is oriented to person, place, and time. She appears well-developed and well-nourished. No distress.  HENT:  Head: Normocephalic and atraumatic.  Eyes: EOM are normal.  Neck: Neck supple.  Cardiovascular: Normal rate.   Respiratory: Effort normal.  GI: Soft. There is tenderness in the suprapubic area. There is no rigidity, no rebound, no guarding and no CVA tenderness.  Genitourinary:       External genitalia without lesions. White discharge vaginal vault. Cervix long, closed, no CMT, no adnexal tenderness. Unable to palpate uterus due to patient habitus.  Musculoskeletal: Normal range of motion.  Neurological: She is alert and oriented to person, place, and time.  Skin: Skin  is warm and dry.  Psychiatric: She has a normal mood and affect. Her behavior is normal. Judgment and thought content normal.   Results for orders placed during the hospital encounter of 05/28/12 (from the past 24 hour(s))  URINALYSIS, ROUTINE W REFLEX MICROSCOPIC     Status: Abnormal   Collection Time   05/28/12 10:47 PM      Component Value Range   Color, Urine YELLOW  YELLOW   APPearance CLEAR  CLEAR   Specific Gravity, Urine 1.025   1.005 - 1.030   pH 5.5  5.0 - 8.0   Glucose, UA NEGATIVE  NEGATIVE mg/dL   Hgb urine dipstick NEGATIVE  NEGATIVE   Bilirubin Urine NEGATIVE  NEGATIVE   Ketones, ur NEGATIVE  NEGATIVE mg/dL   Protein, ur NEGATIVE  NEGATIVE mg/dL   Urobilinogen, UA 0.2  0.0 - 1.0 mg/dL   Nitrite POSITIVE (*) NEGATIVE   Leukocytes, UA TRACE (*) NEGATIVE  URINE MICROSCOPIC-ADD ON     Status: Normal   Collection Time   05/28/12 10:47 PM      Component Value Range   Squamous Epithelial / LPF RARE  RARE   WBC, UA 0-2  <3 WBC/hpf   RBC / HPF 0-2  <3 RBC/hpf   Bacteria, UA RARE  RARE  POCT PREGNANCY, URINE     Status: Abnormal   Collection Time   05/28/12 10:53 PM      Component Value Range   Preg Test, Ur POSITIVE (*) NEGATIVE  CBC WITH DIFFERENTIAL     Status: Abnormal   Collection Time   05/29/12 12:15 AM      Component Value Range   WBC 7.5  4.0 - 10.5 K/uL   RBC 4.30  3.87 - 5.11 MIL/uL   Hemoglobin 13.0  12.0 - 15.0 g/dL   HCT 19.1  47.8 - 29.5 %   MCV 85.8  78.0 - 100.0 fL   MCH 30.2  26.0 - 34.0 pg   MCHC 35.2  30.0 - 36.0 g/dL   RDW 62.1  30.8 - 65.7 %   Platelets 212  150 - 400 K/uL   Neutrophils Relative 44  43 - 77 %   Neutro Abs 3.3  1.7 - 7.7 K/uL   Lymphocytes Relative 47 (*) 12 - 46 %   Lymphs Abs 3.5  0.7 - 4.0 K/uL   Monocytes Relative 6  3 - 12 %   Monocytes Absolute 0.5  0.1 - 1.0 K/uL   Eosinophils Relative 3  0 - 5 %   Eosinophils Absolute 0.2  0.0 - 0.7 K/uL   Basophils Relative 0  0 - 1 %   Basophils Absolute 0.0  0.0 - 0.1 K/uL  HCG, QUANTITATIVE, PREGNANCY     Status: Abnormal   Collection Time   05/29/12 12:15 AM      Component Value Range   hCG, Beta Chain, Quant, S 41069 (*) <5 mIU/mL  WET PREP, GENITAL     Status: Abnormal   Collection Time   05/29/12 12:16 AM      Component Value Range   Yeast Wet Prep HPF POC NONE SEEN  NONE SEEN   Trich, Wet Prep NONE SEEN  NONE SEEN   Clue Cells Wet Prep HPF POC NONE SEEN  NONE SEEN   WBC, Wet Prep HPF POC MODERATE  (*) NONE SEEN   MAU Course  Procedures  US Ob Comp Less 14 Wks  05/29/2012  *RADIOLOGY REPORT*  Clinical Data: Left-sided abdominal  pain.  OBSTETRIC <14 WK ULTRASOUND  Technique:  Transabdominal ultrasound was performed for evaluation of the gestation as well as the maternal uterus and adnexal regions.  Comparison:  No priors.  Intrauterine gestational sac: Single gestational sac, ovoid in shape. Yolk sac: Present. Embryo: Present. Cardiac Activity: Present. Heart Rate: 116 bpm  CRL: 4.7 mm  6 w  2 d         [place] EDC: 01/20/2013  Maternal uterus/Adnexae: The ovaries are normal in echotexture and appearance bilaterally.  No free fluid the cul-de-sac.  No findings to suggest subchorionic hemorrhage at this time.  IMPRESSION: 1.  Single viable IUP with an estimated gestational age of [redacted] weeks and 2 days, and fetal heart rate of 116 beats per minute.   Original Report Authenticated By: Trudie Reed, M.D.     Assessment: 26 y.o. female @ 6 weeks 2 days gestation with abdominal cramping   UTI in pregnancy   IUP  Plan:  Start prenatal care, return as needed   Rx Keflex   Urine sent for culture  I have reviewed this patient's vital signs, nurses notes, appropriate labs and imaging. I have discussed findings with the patient and plan of care and she voices understanding.   Medication List     As of 05/29/2012  1:36 AM    START taking these medications         cephALEXin 500 MG capsule   Commonly known as: KEFLEX   Take 1 capsule (500 mg total) by mouth 4 (four) times daily.      CONTINUE taking these medications         EPINEPHrine 0.3 mg/0.3 mL Devi   Commonly known as: EPI-PEN   Inject 0.3 mLs (0.3 mg total) into the muscle once.          Where to get your medications    These are the prescriptions that you need to pick up. We sent them to a specific pharmacy, so you will need to go there to get them.   [id number] [place],  - [address]    [address] [place] [address]    Phone: [phone]        cephALEXin 500 MG capsule            Layn Kye, RN, FNP, [facility] 05/28/2012, 11:59 PM

## 2012-05-28 NOTE — MAU Note (Signed)
Pt LMP 04/10/2012, hx irreg periods, having LLQ pain x 2 days.

## 2012-05-29 ENCOUNTER — Inpatient Hospital Stay (HOSPITAL_COMMUNITY): Payer: Medicaid Other

## 2012-05-29 LAB — CBC WITH DIFFERENTIAL/PLATELET
Eosinophils Absolute: 0.2 10*3/uL (ref 0.0–0.7)
Hemoglobin: 13 g/dL (ref 12.0–15.0)
Lymphocytes Relative: 47 % — ABNORMAL HIGH (ref 12–46)
Lymphs Abs: 3.5 10*3/uL (ref 0.7–4.0)
MCH: 30.2 pg (ref 26.0–34.0)
Monocytes Relative: 6 % (ref 3–12)
Neutrophils Relative %: 44 % (ref 43–77)
RBC: 4.3 MIL/uL (ref 3.87–5.11)
WBC: 7.5 10*3/uL (ref 4.0–10.5)

## 2012-05-29 LAB — WET PREP, GENITAL: Clue Cells Wet Prep HPF POC: NONE SEEN

## 2012-05-29 MED ORDER — CEPHALEXIN 500 MG PO CAPS: 500.0000 mg | ORAL_CAPSULE | Freq: Four times a day (QID) | ORAL | Status: DC

## 2012-05-30 NOTE — MAU Provider Note (Signed)
Attestation of Attending Supervision of Advanced Practitioner (CNM/NP): Evaluation and management procedures were performed by the Advanced Practitioner under my supervision and collaboration.  I have reviewed the Advanced Practitioner's note and chart, and I agree with the management and plan.  UGONNA  ANYANWU, MD, FACOG Attending Obstetrician & Gynecologist Faculty Practice, [facility]  

## 2012-06-05 ENCOUNTER — Encounter (HOSPITAL_COMMUNITY): Payer: Self-pay | Admitting: *Deleted

## 2012-06-05 ENCOUNTER — Inpatient Hospital Stay (HOSPITAL_COMMUNITY)
Admission: AD | Admit: 2012-06-05 | Discharge: 2012-06-05 | Disposition: A | Discharge status: Home / Self Care | Payer: Medicaid Other | Source: Ambulatory Visit | Attending: Obstetrics & Gynecology | Admitting: Obstetrics & Gynecology

## 2012-06-05 DIAGNOSIS — J209 Acute bronchitis, unspecified: Secondary | ICD-10-CM

## 2012-06-05 DIAGNOSIS — J069 Acute upper respiratory infection, unspecified: Secondary | ICD-10-CM | POA: Insufficient documentation

## 2012-06-05 DIAGNOSIS — B373 Candidiasis of vulva and vagina: Secondary | ICD-10-CM

## 2012-06-05 DIAGNOSIS — O2390 Unspecified genitourinary tract infection in pregnancy, unspecified trimester: Secondary | ICD-10-CM | POA: Insufficient documentation

## 2012-06-05 DIAGNOSIS — B3731 Acute candidiasis of vulva and vagina: Secondary | ICD-10-CM | POA: Insufficient documentation

## 2012-06-05 DIAGNOSIS — J40 Bronchitis, not specified as acute or chronic: Secondary | ICD-10-CM | POA: Insufficient documentation

## 2012-06-05 DIAGNOSIS — R079 Chest pain, unspecified: Secondary | ICD-10-CM | POA: Insufficient documentation

## 2012-06-05 DIAGNOSIS — J208 Acute bronchitis due to other specified organisms: Secondary | ICD-10-CM

## 2012-06-05 DIAGNOSIS — R05 Cough: Secondary | ICD-10-CM | POA: Insufficient documentation

## 2012-06-05 DIAGNOSIS — R059 Cough, unspecified: Secondary | ICD-10-CM | POA: Insufficient documentation

## 2012-06-05 DIAGNOSIS — B9789 Other viral agents as the cause of diseases classified elsewhere: Secondary | ICD-10-CM

## 2012-06-05 DIAGNOSIS — O99891 Other specified diseases and conditions complicating pregnancy: Secondary | ICD-10-CM | POA: Insufficient documentation

## 2012-06-05 HISTORY — DX: Dorsalgia, unspecified: M54.9

## 2012-06-05 HISTORY — DX: Unspecified infectious disease: B99.9

## 2012-06-05 HISTORY — DX: Reserved for concepts with insufficient information to code with codable children: IMO0002

## 2012-06-05 HISTORY — DX: Gestational (pregnancy-induced) hypertension without significant proteinuria, unspecified trimester: O13.9

## 2012-06-05 HISTORY — DX: Headache: R51

## 2012-06-05 MED ORDER — FLUCONAZOLE 150 MG PO TABS: 150.0000 mg | ORAL_TABLET | Freq: Once | ORAL | Status: DC

## 2012-06-05 MED ORDER — GUAIFENESIN-DM 100-10 MG/5ML PO SYRP: 5.0000 mL | ORAL_SOLUTION | Freq: Three times a day (TID) | ORAL | Status: DC | PRN

## 2012-06-05 NOTE — MAU Note (Signed)
Symptoms started on Fri, productive cough- greenish yellow with blood streaks.   Pain in upper chest when coughs- sharp- shoots across.  Fever this morning of 102

## 2012-06-05 NOTE — MAU Provider Note (Signed)
Attestation of Attending Supervision of Advanced Practitioner (CNM/NP): Evaluation and management procedures were performed by the Advanced Practitioner under my supervision and collaboration.  I have reviewed the Advanced Practitioner's note and chart, and I agree with the management and plan.  HARRAWAY-SMITH, Marisah Laker 8:47 PM

## 2012-06-05 NOTE — MAU Provider Note (Signed)
History     CSN: [id number]  Arrival date and time: 06/05/12 7829       Chief Complaint  Patient presents with  . Cough  . Fever   HPI Comments: [name] is a 26 y.o. [phone] female with an estimated gestational age of [redacted]w[redacted]d estimated by LMP of 04/10/12 with a productive cough and chest pain since Friday. Symptoms have gotten progressively worse.  Endorses a fever of 102, muscle aches and chills. Has taken Sudafed which helped the first few days, but not today. Reports nasal congestion, sore throat, voice hoarseness, greenish yellow sputum with a few instances of bright red blood streaks yesterday and today. Reports some shortness of breath at times, but not bad. Chest pain is sharp and shooting, especially with coughing.  Denies palpitations, headaches, nausea, vomiting, abdominal pain, cramping, back pain. Denies any sick contacts. Denies having this problem recently.  Patient is concerned the Keflex she is taking for her UTI is giving her a yeast infection. Endorses vaginal itchiness and white discharge for a few days. Denies vaginal bleeding or abdominal/pelvic pain.    Past Medical History  Diagnosis Date  . Obesity   . Asthma     Past Surgical History  Procedure Date  . Tonsilectomy, adenoidectomy, bilateral myringotomy and tubes     No family history on file.  History  Substance Use Topics  . Smoking status: Never Smoker   . Smokeless tobacco: Not on file  . Alcohol Use: No    Allergies:  Allergies  Allergen Reactions  . Iodine Anaphylaxis  . Shellfish Allergy Anaphylaxis    Prescriptions prior to admission  Medication Sig Dispense Refill  . cephALEXin (KEFLEX) 500 MG capsule Take 500 mg by mouth 4 (four) times daily. 7 day course, not yet completed      . Phenylephrine-DM-GG-APAP (SUDAFED PE PRESSURE+PAIN+COLD) 5-10-100-325 MG TABS Take 2 tablets by mouth every 4 (four) hours as needed. For cold symptoms      . Prenatal Vit-Fe Fumarate-FA (PRENATAL  MULTIVITAMIN) TABS Take 1 tablet by mouth daily.      Marland Kitchen EPINEPHrine (EPI-PEN) 0.3 mg/0.3 mL DEVI Inject 0.3 mLs (0.3 mg total) into the muscle once.  1 Device  0    Review of Systems  Constitutional: Positive for fever, chills and malaise/fatigue.  HENT: Positive for congestion.   Respiratory: Positive for cough, sputum production (greenish, with streaks of blood at times) and shortness of breath.   Cardiovascular: Positive for chest pain. Negative for palpitations.  Gastrointestinal: Negative for nausea, vomiting and abdominal pain.  Genitourinary:       Vaginal itch with white discharge  Musculoskeletal: Positive for myalgias.  Neurological: Negative for headaches.   Physical Exam   Blood pressure 138/87, pulse 98, temperature 98.3 F (36.8 C), temperature source Oral, height 5\' 3"  (1.6 m), weight 125.193 kg (276 lb), last menstrual period 04/10/2012, SpO2 100.00%.  Physical Exam  Nursing note and vitals reviewed. Constitutional: She is oriented to person, place, and time. She appears well-developed and well-nourished. No distress.  Cardiovascular: Normal rate, regular rhythm and normal heart sounds.        HR is high-normal at 98.  Respiratory: Effort normal. Not tachypneic. No respiratory distress. She has no decreased breath sounds. She has no wheezes. She has no rhonchi. She has no rales.  Neurological: She is alert and oriented to person, place, and time.  Skin: Skin is warm and dry.  Psychiatric: She has a normal mood and affect.  MAU Course  Procedures    Assessment and Plan  [name] is a 26 y.o. [id number] female with an estimated gestational age of [redacted]w[redacted]d by LMP of 04/10/12. 1. Viral URI with bronchitis, laryngitis  Robitussin DM for cough  Advised resting her voice, tea with honey/lemon, throat lozenges 2. Vaginal yeast infection  Diflucan     Marnee Spring 06/05/2012, 12:15 PM   Evaluation and management procedures were performed by PA-S under  my supervision/collaboration. Chart reviewed, patient examined by me and I agree with management and plan. Lungs CTAB.  Discussed flu criteria with Dr. Dolan Amen. See AVS. Follow-up Information    Follow up with [facility] [place]. In 1 week.   Contact information:   [address] [place] [address] [phone]

## 2012-07-03 ENCOUNTER — Other Ambulatory Visit (HOSPITAL_COMMUNITY): Payer: Self-pay | Admitting: Family Medicine

## 2012-07-03 ENCOUNTER — Other Ambulatory Visit: Payer: Self-pay | Admitting: Family Medicine

## 2012-07-03 DIAGNOSIS — Z3682 Encounter for antenatal screening for nuchal translucency: Secondary | ICD-10-CM

## 2012-07-03 LAB — OB RESULTS CONSOLE ANTIBODY SCREEN: Antibody Screen: NEGATIVE

## 2012-07-03 LAB — OB RESULTS CONSOLE HEPATITIS B SURFACE ANTIGEN: Hepatitis B Surface Ag: NEGATIVE

## 2012-07-03 LAB — OB RESULTS CONSOLE RUBELLA ANTIBODY, IGM: Rubella: IMMUNE

## 2012-07-03 LAB — OB RESULTS CONSOLE HIV ANTIBODY (ROUTINE TESTING)
HIV: NONREACTIVE
HIV: NONREACTIVE

## 2012-07-04 NOTE — L&D Delivery Note (Signed)
Delivery Note At 10:57 AM a viable and healthy female was delivered via Vaginal, Spontaneous Delivery (Presentation: Left Occiput Anterior).  APGAR: 8, 9; weight .   Placenta status: Intact, Spontaneous.  Cord: 3 vessels with the following complications: None.   Anesthesia: Epidural  Episiotomy: None Lacerations: 1st degree;Labial Suture Repair: n/a Est. Blood Loss (mL): 100  Mom to postpartum.  Baby to with mother.  [facility] 01/15/2013, 11:16 AM

## 2012-07-06 ENCOUNTER — Other Ambulatory Visit: Payer: Self-pay

## 2012-07-06 ENCOUNTER — Ambulatory Visit (HOSPITAL_COMMUNITY)
Admission: RE | Admit: 2012-07-06 | Discharge: 2012-07-06 | Disposition: A | Discharge status: Home / Self Care | Payer: Medicaid Other | Source: Ambulatory Visit | Attending: Family Medicine | Admitting: Family Medicine

## 2012-07-06 ENCOUNTER — Encounter (HOSPITAL_COMMUNITY): Payer: Self-pay

## 2012-07-06 DIAGNOSIS — O351XX0 Maternal care for (suspected) chromosomal abnormality in fetus, not applicable or unspecified: Secondary | ICD-10-CM | POA: Insufficient documentation

## 2012-07-06 DIAGNOSIS — O3510X0 Maternal care for (suspected) chromosomal abnormality in fetus, unspecified, not applicable or unspecified: Secondary | ICD-10-CM | POA: Insufficient documentation

## 2012-07-06 DIAGNOSIS — E669 Obesity, unspecified: Secondary | ICD-10-CM | POA: Insufficient documentation

## 2012-07-06 DIAGNOSIS — Z3682 Encounter for antenatal screening for nuchal translucency: Secondary | ICD-10-CM

## 2012-07-06 DIAGNOSIS — Z3689 Encounter for other specified antenatal screening: Secondary | ICD-10-CM | POA: Insufficient documentation

## 2012-07-06 NOTE — Progress Notes (Signed)
[name]  was seen today for an ultrasound appointment.  See full report in [facility].  Alpha Gula, MD

## 2012-07-30 ENCOUNTER — Other Ambulatory Visit (HOSPITAL_COMMUNITY): Payer: Self-pay | Admitting: Nurse Practitioner

## 2012-07-30 DIAGNOSIS — Z3689 Encounter for other specified antenatal screening: Secondary | ICD-10-CM

## 2012-08-13 ENCOUNTER — Encounter (HOSPITAL_COMMUNITY): Payer: Self-pay | Admitting: *Deleted

## 2012-08-13 ENCOUNTER — Inpatient Hospital Stay (HOSPITAL_COMMUNITY)
Admission: AD | Admit: 2012-08-13 | Discharge: 2012-08-13 | Disposition: A | Discharge status: Home / Self Care | Payer: Medicaid Other | Source: Ambulatory Visit | Attending: Obstetrics & Gynecology | Admitting: Obstetrics & Gynecology

## 2012-08-13 DIAGNOSIS — K529 Noninfective gastroenteritis and colitis, unspecified: Secondary | ICD-10-CM

## 2012-08-13 DIAGNOSIS — R109 Unspecified abdominal pain: Secondary | ICD-10-CM | POA: Insufficient documentation

## 2012-08-13 DIAGNOSIS — O99891 Other specified diseases and conditions complicating pregnancy: Secondary | ICD-10-CM | POA: Insufficient documentation

## 2012-08-13 DIAGNOSIS — K5289 Other specified noninfective gastroenteritis and colitis: Secondary | ICD-10-CM

## 2012-08-13 DIAGNOSIS — A088 Other specified intestinal infections: Secondary | ICD-10-CM | POA: Insufficient documentation

## 2012-08-13 LAB — CBC
HCT: 33.5 % — ABNORMAL LOW (ref 36.0–46.0)
Hemoglobin: 11.6 g/dL — ABNORMAL LOW (ref 12.0–15.0)
MCHC: 34.6 g/dL (ref 30.0–36.0)
RBC: 3.86 MIL/uL — ABNORMAL LOW (ref 3.87–5.11)

## 2012-08-13 LAB — URINALYSIS, ROUTINE W REFLEX MICROSCOPIC
Bilirubin Urine: NEGATIVE
Glucose, UA: NEGATIVE mg/dL
Ketones, ur: 15 mg/dL — AB
Nitrite: NEGATIVE
Specific Gravity, Urine: 1.02 (ref 1.005–1.030)
pH: 6 (ref 5.0–8.0)

## 2012-08-13 LAB — COMPREHENSIVE METABOLIC PANEL
ALT: 17 U/L (ref 0–35)
Alkaline Phosphatase: 56 U/L (ref 39–117)
BUN: 6 mg/dL (ref 6–23)
CO2: 22 mEq/L (ref 19–32)
GFR calc Af Amer: >90 mL/min (ref >90)
GFR calc non Af Amer: >90 mL/min (ref >90)
Glucose, Bld: 86 mg/dL (ref 70–99)
Potassium: 3.6 mEq/L (ref 3.5–5.1)
Sodium: 134 mEq/L — ABNORMAL LOW (ref 135–145)
Total Bilirubin: 0.4 mg/dL (ref 0.3–1.2)
Total Protein: 6.6 g/dL (ref 6.0–8.3)

## 2012-08-13 LAB — URINE MICROSCOPIC-ADD ON

## 2012-08-13 MED ORDER — ONDANSETRON 8 MG PO TBDP
8.0000 mg | ORAL_TABLET | Freq: Once | ORAL | Status: AC
  Administered 2012-08-13: 8 mg via ORAL
  Filled 2012-08-13: qty 1

## 2012-08-13 MED ORDER — FAMOTIDINE 20 MG PO TABS: 20.0000 mg | ORAL_TABLET | Freq: Two times a day (BID) | ORAL | Status: DC | PRN

## 2012-08-13 MED ORDER — ONDANSETRON HCL 4 MG PO TABS: 4.0000 mg | ORAL_TABLET | Freq: Four times a day (QID) | ORAL | Status: DC

## 2012-08-13 NOTE — MAU Provider Note (Signed)
History     CSN: [id number]  Arrival date and time: 08/13/12 1842   None     Chief Complaint  Patient presents with  . Abdominal Pain   HPI [name] is a 27 y.o. female @ [redacted]w[redacted]d gestation who presents to MAU with abdominal pain. This is a new problem. The pain started approximately 6 am.  The pain is located over the entire abdomen.  She describes the pain as cramping that comes and goes.  She rates the pain as 9/10 at its worst and 6/10 currently.  She denies vaginal bleeding or leaking of fluid. Vomited x 4 earlier today. Continues to have nausea. Prenatal care at @ [facility]. The history was provided by the patient.  OB History   Grav Para Term Preterm Abortions TAB SAB Ect Mult Living   4 3 3       3       Past Medical History  Diagnosis Date  . Obesity   . Asthma   . Headache   . Pregnancy induced hypertension   . Infection     Urinary tract infection  . Back pain     herniated disk  . Abnormal Pap smear     follow up ok    Past Surgical History  Procedure Laterality Date  . Tonsilectomy, adenoidectomy, bilateral myringotomy and tubes      Family History  Problem Relation Age of Onset  . Other Neg Hx     History  Substance Use Topics  . Smoking status: Former Smoker    Types: Cigarettes  . Smokeless tobacco: Never Used     Comment: quit with pos preg  . Alcohol Use: No    Allergies:  Allergies  Allergen Reactions  . Iodine Anaphylaxis  . Shellfish Allergy Anaphylaxis    Prescriptions prior to admission  Medication Sig Dispense Refill  . acetaminophen (TYLENOL) 325 MG tablet Take 650 mg by mouth daily as needed for pain.      . Prenatal Vit-Fe Fumarate-FA (PRENATAL MULTIVITAMIN) TABS Take 1 tablet by mouth daily.      Marland Kitchen EPINEPHrine (EPI-PEN) 0.3 mg/0.3 mL DEVI Inject 0.3 mLs (0.3 mg total) into the muscle once.  1 Device  0    Review of Systems  Constitutional: Negative for fever, chills and weight loss.  HENT: Negative for ear  pain, nosebleeds, congestion, sore throat and neck pain.   Eyes: Negative for blurred vision, double vision, photophobia and pain.  Respiratory: Negative for cough, shortness of breath and wheezing.   Cardiovascular: Negative for chest pain, palpitations and leg swelling.  Gastrointestinal: Positive for nausea, vomiting and abdominal pain. Negative for heartburn, diarrhea and constipation.  Genitourinary: Negative for dysuria, urgency and frequency.  Musculoskeletal: Negative for myalgias and back pain.  Skin: Negative for itching and rash.  Neurological: Positive for dizziness. Negative for sensory change, speech change, seizures, weakness and headaches.  Endo/Heme/Allergies: Does not bruise/bleed easily.  Psychiatric/Behavioral: Negative for depression and substance abuse. The patient is not nervous/anxious and does not have insomnia.    Blood pressure 117/69, pulse 101, temperature 98 F (36.7 C), temperature source Oral, resp. rate 20, height 5\' 2"  (1.575 m), weight 277 lb (125.646 kg), last menstrual period 04/10/2012, SpO2 100.00%.  Physical Exam  Nursing note and vitals reviewed. Constitutional: She is oriented to person, place, and time. She appears well-developed and well-nourished. No distress.  HENT:  Head: Normocephalic and atraumatic.  Eyes: EOM are normal.  Neck: Neck supple.  Cardiovascular:  Normal rate.   Respiratory: Effort normal.  GI: Soft. There is tenderness in the epigastric area. There is no rebound and no guarding.  Positive fetal heart tones.  Musculoskeletal: Normal range of motion.  Neurological: She is alert and oriented to person, place, and time.  Skin: Skin is warm and dry.  Psychiatric: She has a normal mood and affect. Her behavior is normal. Judgment and thought content normal.   Procedures  Labs pending Care turned over to Joseph Berkshire, PAC @ 20:00  Freddi Starr., RN, FNP, [facility] 08/13/2012, 8:36 PM   0200 - Assumed care from [facility],  FNP Lab results pending   2006 - patient given ODT Zofran - patient reports nausea is much improved; patient able to take in PO water here  Results for orders placed during the hospital encounter of 08/13/12 (from the past 24 hour(s))  URINALYSIS, ROUTINE W REFLEX MICROSCOPIC     Status: Abnormal   Collection Time    08/13/12  7:00 PM      Result Value Range   Color, Urine YELLOW  YELLOW   APPearance CLEAR  CLEAR   Specific Gravity, Urine 1.020  1.005 - 1.030   pH 6.0  5.0 - 8.0   Glucose, UA NEGATIVE  NEGATIVE mg/dL   Hgb urine dipstick NEGATIVE  NEGATIVE   Bilirubin Urine NEGATIVE  NEGATIVE   Ketones, ur 15 (*) NEGATIVE mg/dL   Protein, ur NEGATIVE  NEGATIVE mg/dL   Urobilinogen, UA 4.0 (*) 0.0 - 1.0 mg/dL   Nitrite NEGATIVE  NEGATIVE   Leukocytes, UA SMALL (*) NEGATIVE  URINE MICROSCOPIC-ADD ON     Status: Abnormal   Collection Time    08/13/12  7:00 PM      Result Value Range   Squamous Epithelial / LPF FEW (*) RARE   WBC, UA 3-6  <3 WBC/hpf   RBC / HPF 3-6  <3 RBC/hpf   Bacteria, UA FEW (*) RARE  CBC     Status: Abnormal   Collection Time    08/13/12  7:48 PM      Result Value Range   WBC 4.7  4.0 - 10.5 K/uL   RBC 3.86 (*) 3.87 - 5.11 MIL/uL   Hemoglobin 11.6 (*) 12.0 - 15.0 g/dL   HCT 16.1 (*) 09.6 - 04.5 %   MCV 86.8  78.0 - 100.0 fL   MCH 30.1  26.0 - 34.0 pg   MCHC 34.6  30.0 - 36.0 g/dL   RDW 40.9  81.1 - 91.4 %   Platelets 169  150 - 400 K/uL  COMPREHENSIVE METABOLIC PANEL     Status: Abnormal   Collection Time    08/13/12  7:48 PM      Result Value Range   Sodium 134 (*) 135 - 145 mEq/L   Potassium 3.6  3.5 - 5.1 mEq/L   Chloride 100  96 - 112 mEq/L   CO2 22  19 - 32 mEq/L   Glucose, Bld 86  70 - 99 mg/dL   BUN 6  6 - 23 mg/dL   Creatinine, Ser 7.82  0.50 - 1.10 mg/dL   Calcium 8.8  8.4 - 95.6 mg/dL   Total Protein 6.6  6.0 - 8.3 g/dL   Albumin 3.0 (*) 3.5 - 5.2 g/dL   AST 16  0 - 37 U/L   ALT 17  0 - 35 U/L   Alkaline Phosphatase 56  39 - 117  U/L   Total Bilirubin 0.4  0.3 - 1.2 mg/dL   GFR calc non Af Amer >90  >90 mL/min   GFR calc Af Amer >90  >90 mL/min    A: Viral gastroenteritis in pregnancy  P: Discharge home Rx for Zofran and Pepcid sent to patient's pharmacy Patient will keep follow-up at [facility] as scheduled Encouraged increased PO hydration as tolerated Patient may return to MAU as needed or if her condition should change or worsen  Freddi Starr, PA-C 08/13/2012 8:37 PM

## 2012-08-13 NOTE — MAU Note (Signed)
Patient states she started having upper abdominal pain and has vomited x 4 today. Denies bleeding or leaking.

## 2012-08-15 ENCOUNTER — Ambulatory Visit (HOSPITAL_COMMUNITY)
Admission: RE | Admit: 2012-08-15 | Discharge: 2012-08-15 | Disposition: A | Discharge status: Home / Self Care | Payer: Medicaid Other | Source: Ambulatory Visit | Attending: Nurse Practitioner | Admitting: Nurse Practitioner

## 2012-08-15 DIAGNOSIS — O99210 Obesity complicating pregnancy, unspecified trimester: Secondary | ICD-10-CM | POA: Insufficient documentation

## 2012-08-15 DIAGNOSIS — O09299 Supervision of pregnancy with other poor reproductive or obstetric history, unspecified trimester: Secondary | ICD-10-CM | POA: Insufficient documentation

## 2012-08-15 DIAGNOSIS — O3440 Maternal care for other abnormalities of cervix, unspecified trimester: Secondary | ICD-10-CM | POA: Insufficient documentation

## 2012-08-15 DIAGNOSIS — Z363 Encounter for antenatal screening for malformations: Secondary | ICD-10-CM | POA: Insufficient documentation

## 2012-08-15 DIAGNOSIS — E669 Obesity, unspecified: Secondary | ICD-10-CM | POA: Insufficient documentation

## 2012-08-15 DIAGNOSIS — Z1389 Encounter for screening for other disorder: Secondary | ICD-10-CM | POA: Insufficient documentation

## 2012-08-15 DIAGNOSIS — O358XX0 Maternal care for other (suspected) fetal abnormality and damage, not applicable or unspecified: Secondary | ICD-10-CM | POA: Insufficient documentation

## 2012-08-15 LAB — URINE CULTURE: Colony Count: >=100000

## 2012-08-16 ENCOUNTER — Inpatient Hospital Stay (HOSPITAL_COMMUNITY): Admission: RE | Admit: 2012-08-16 | Payer: Self-pay | Source: Ambulatory Visit

## 2012-08-16 ENCOUNTER — Ambulatory Visit (HOSPITAL_COMMUNITY): Admission: RE | Admit: 2012-08-16 | Payer: Medicaid Other | Source: Ambulatory Visit

## 2012-08-17 ENCOUNTER — Other Ambulatory Visit (HOSPITAL_COMMUNITY): Payer: Self-pay

## 2012-08-25 NOTE — MAU Provider Note (Signed)
Attestation of Attending Supervision of Advanced Practitioner (CNM/NP): Evaluation and management procedures were performed by the Advanced Practitioner under my supervision and collaboration. I have reviewed the Advanced Practitioner's note and chart, and I agree with the management and plan.  Zaccheaus Storlie H. 3:14 PM

## 2012-08-27 ENCOUNTER — Other Ambulatory Visit: Payer: Self-pay | Admitting: Family Medicine

## 2012-08-27 DIAGNOSIS — Z3689 Encounter for other specified antenatal screening: Secondary | ICD-10-CM

## 2012-09-13 ENCOUNTER — Ambulatory Visit (HOSPITAL_COMMUNITY)
Admission: RE | Admit: 2012-09-13 | Discharge: 2012-09-13 | Disposition: A | Discharge status: Home / Self Care | Payer: Medicaid Other | Source: Ambulatory Visit | Attending: Family Medicine | Admitting: Family Medicine

## 2012-09-13 ENCOUNTER — Other Ambulatory Visit: Payer: Self-pay | Admitting: Obstetrics & Gynecology

## 2012-09-13 DIAGNOSIS — Z0489 Encounter for examination and observation for other specified reasons: Secondary | ICD-10-CM

## 2012-09-13 DIAGNOSIS — Z3689 Encounter for other specified antenatal screening: Secondary | ICD-10-CM | POA: Insufficient documentation

## 2012-09-13 DIAGNOSIS — E669 Obesity, unspecified: Secondary | ICD-10-CM | POA: Insufficient documentation

## 2012-09-13 DIAGNOSIS — O09299 Supervision of pregnancy with other poor reproductive or obstetric history, unspecified trimester: Secondary | ICD-10-CM | POA: Insufficient documentation

## 2012-09-13 DIAGNOSIS — O3440 Maternal care for other abnormalities of cervix, unspecified trimester: Secondary | ICD-10-CM | POA: Insufficient documentation

## 2012-12-02 ENCOUNTER — Encounter (HOSPITAL_COMMUNITY): Payer: Self-pay | Admitting: Family

## 2012-12-02 ENCOUNTER — Inpatient Hospital Stay (HOSPITAL_COMMUNITY)
Admission: AD | Admit: 2012-12-02 | Discharge: 2012-12-02 | Disposition: A | Discharge status: Home / Self Care | Payer: Medicaid Other | Source: Ambulatory Visit | Attending: Obstetrics & Gynecology | Admitting: Obstetrics & Gynecology

## 2012-12-02 DIAGNOSIS — N949 Unspecified condition associated with female genital organs and menstrual cycle: Secondary | ICD-10-CM

## 2012-12-02 DIAGNOSIS — N390 Urinary tract infection, site not specified: Secondary | ICD-10-CM | POA: Insufficient documentation

## 2012-12-02 DIAGNOSIS — O2390 Unspecified genitourinary tract infection in pregnancy, unspecified trimester: Secondary | ICD-10-CM | POA: Insufficient documentation

## 2012-12-02 DIAGNOSIS — O2343 Unspecified infection of urinary tract in pregnancy, third trimester: Secondary | ICD-10-CM

## 2012-12-02 DIAGNOSIS — R109 Unspecified abdominal pain: Secondary | ICD-10-CM | POA: Insufficient documentation

## 2012-12-02 LAB — URINALYSIS, ROUTINE W REFLEX MICROSCOPIC
Glucose, UA: NEGATIVE mg/dL
Ketones, ur: NEGATIVE mg/dL
Nitrite: NEGATIVE
Protein, ur: NEGATIVE mg/dL
Urobilinogen, UA: 0.2 mg/dL (ref 0.0–1.0)

## 2012-12-02 LAB — URINE MICROSCOPIC-ADD ON

## 2012-12-02 MED ORDER — NITROFURANTOIN MONOHYD MACRO 100 MG PO CAPS: 100.0000 mg | ORAL_CAPSULE | Freq: Two times a day (BID) | ORAL | Status: DC

## 2012-12-02 NOTE — MAU Provider Note (Signed)
History     CSN: [id number]  Arrival date and time: 12/02/12 0108   First Provider Initiated Contact with Patient 12/02/12 0210      Chief Complaint  Patient presents with  . Abdominal Pain   HPI  Pt is a G4P3003 here at 33.5 wks IUP with report of lower pelvic pressure and left groin pain.  Pt reports feeling shooting pains that started around 2030 last night.  No report of vaginal bleeding or leaking of fluid.  +fetal movement.    Past Medical History  Diagnosis Date  . Obesity   . Asthma   . Headache(784.0)   . Pregnancy induced hypertension   . Infection     Urinary tract infection  . Back pain     herniated disk  . Abnormal Pap smear     follow up ok    Past Surgical History  Procedure Laterality Date  . Tonsilectomy, adenoidectomy, bilateral myringotomy and tubes      Family History  Problem Relation Age of Onset  . Other Neg Hx     History  Substance Use Topics  . Smoking status: Former Smoker    Types: Cigarettes  . Smokeless tobacco: Never Used     Comment: quit with pos preg  . Alcohol Use: No    Allergies:  Allergies  Allergen Reactions  . Iodine Anaphylaxis  . Shellfish Allergy Anaphylaxis    Prescriptions prior to admission  Medication Sig Dispense Refill  . acetaminophen (TYLENOL) 325 MG tablet Take 650 mg by mouth daily as needed for pain.      Marland Kitchen EPINEPHrine (EPI-PEN) 0.3 mg/0.3 mL DEVI Inject 0.3 mLs (0.3 mg total) into the muscle once.  1 Device  0  . famotidine (PEPCID) 20 MG tablet Take 1 tablet (20 mg total) by mouth 2 (two) times daily as needed for heartburn.  30 tablet  0  . ondansetron (ZOFRAN) 4 MG tablet Take 1 tablet (4 mg total) by mouth every 6 (six) hours.  12 tablet  0  . Prenatal Vit-Fe Fumarate-FA (PRENATAL MULTIVITAMIN) TABS Take 1 tablet by mouth daily.        Review of Systems  Genitourinary:       Pelvic pressure  Musculoskeletal: Positive for joint pain (groin pain).  All other systems reviewed and are  negative.   Physical Exam   Blood pressure 124/74, pulse 99, temperature 97.9 F (36.6 C), temperature source Oral, resp. rate 18, last menstrual period 04/10/2012, SpO2 99.00%.  Physical Exam  Constitutional: She is oriented to person, place, and time. She appears well-developed and well-nourished. No distress.  HENT:  Head: Normocephalic.  Neck: Normal range of motion. Neck supple.  Cardiovascular: Normal rate, regular rhythm and normal heart sounds.   Respiratory: Effort normal and breath sounds normal.  GI: Soft. There is no tenderness.  Genitourinary: No bleeding around the vagina. Vaginal discharge (mucusy) found.  Musculoskeletal: Normal range of motion. She exhibits edema (trace bilat pedal).  Neurological: She is alert and oriented to person, place, and time.  Skin: Skin is warm and dry.   FHR 120's, +accels  Toco - x1  MAU Course  Procedures  Results for orders placed during the hospital encounter of 12/02/12 (from the past 24 hour(s))  URINALYSIS, ROUTINE W REFLEX MICROSCOPIC     Status: Abnormal   Collection Time    12/02/12  1:52 AM      Result Value Range   Color, Urine YELLOW  YELLOW   APPearance CLEAR  CLEAR   Specific Gravity, Urine 1.005  1.005 - 1.030   pH 7.5  5.0 - 8.0   Glucose, UA NEGATIVE  NEGATIVE mg/dL   Hgb urine dipstick NEGATIVE  NEGATIVE   Bilirubin Urine NEGATIVE  NEGATIVE   Ketones, ur NEGATIVE  NEGATIVE mg/dL   Protein, ur NEGATIVE  NEGATIVE mg/dL   Urobilinogen, UA 0.2  0.0 - 1.0 mg/dL   Nitrite NEGATIVE  NEGATIVE   Leukocytes, UA MODERATE (*) NEGATIVE  URINE MICROSCOPIC-ADD ON     Status: Abnormal   Collection Time    12/02/12  1:52 AM      Result Value Range   Squamous Epithelial / LPF FEW (*) RARE   WBC, UA 7-10  <3 WBC/hpf   Bacteria, UA FEW (*) RARE     Assessment and Plan  UTI in Pregnancy Round Ligament Pain  Plan: DC to home RX Macrobid Urine Culture Keep scheduled appt with Health  Department  [facility] 12/02/2012, 2:12 AM

## 2012-12-02 NOTE — MAU Provider Note (Signed)
Attestation of Attending Supervision of Advanced Practitioner (CNM/NP): Evaluation and management procedures were performed by the Advanced Practitioner under my supervision and collaboration.  I have reviewed the Advanced Practitioner's note and chart, and I agree with the management and plan.  HARRAWAY-SMITH, Syana Degraffenreid 3:13 AM     

## 2012-12-02 NOTE — MAU Note (Signed)
Patient is in with c/o intense lower abdominal pressure, describes feeling "like baby's head in her pelvic". Patient states that she have pressure for 2 months but its worse today. She denies vaginal bleeding or lof. She reports good fetal movement

## 2012-12-03 LAB — URINE CULTURE

## 2012-12-19 LAB — OB RESULTS CONSOLE GBS: GBS: NEGATIVE

## 2012-12-20 ENCOUNTER — Encounter: Payer: Self-pay | Admitting: *Deleted

## 2013-01-14 ENCOUNTER — Inpatient Hospital Stay (HOSPITAL_COMMUNITY)
Admission: AD | Admit: 2013-01-14 | Discharge: 2013-01-15 | Disposition: A | Discharge status: Home / Self Care | Payer: Medicaid Other | Source: Ambulatory Visit | Attending: Obstetrics & Gynecology | Admitting: Obstetrics & Gynecology

## 2013-01-14 ENCOUNTER — Encounter (HOSPITAL_COMMUNITY): Payer: Self-pay | Admitting: *Deleted

## 2013-01-14 DIAGNOSIS — O479 False labor, unspecified: Secondary | ICD-10-CM | POA: Insufficient documentation

## 2013-01-14 NOTE — MAU Note (Signed)
PT SAYS UC HURT BAD AT 945.      NO VE IN HD.    DENIES HSV.    HAD MRSA- 2007-   HAD BOIT UNDER ARM IN LEFT.   HAS APPOINTMENT TOMORROW AT HD

## 2013-01-15 ENCOUNTER — Inpatient Hospital Stay (HOSPITAL_COMMUNITY)
Admission: AD | Admit: 2013-01-15 | Discharge: 2013-01-17 | DRG: 767 | Disposition: A | Discharge status: Home / Self Care | Payer: Medicaid Other | Source: Ambulatory Visit | Attending: Obstetrics & Gynecology | Admitting: Obstetrics & Gynecology

## 2013-01-15 ENCOUNTER — Encounter (HOSPITAL_COMMUNITY): Payer: Self-pay | Admitting: Anesthesiology

## 2013-01-15 ENCOUNTER — Inpatient Hospital Stay (HOSPITAL_COMMUNITY): Payer: Medicaid Other | Admitting: Anesthesiology

## 2013-01-15 ENCOUNTER — Encounter (HOSPITAL_COMMUNITY): Payer: Self-pay | Admitting: *Deleted

## 2013-01-15 DIAGNOSIS — O139 Gestational [pregnancy-induced] hypertension without significant proteinuria, unspecified trimester: Secondary | ICD-10-CM | POA: Diagnosis present

## 2013-01-15 DIAGNOSIS — O0940 Supervision of pregnancy with grand multiparity, unspecified trimester: Secondary | ICD-10-CM

## 2013-01-15 DIAGNOSIS — O133 Gestational [pregnancy-induced] hypertension without significant proteinuria, third trimester: Secondary | ICD-10-CM

## 2013-01-15 DIAGNOSIS — IMO0001 Reserved for inherently not codable concepts without codable children: Secondary | ICD-10-CM

## 2013-01-15 DIAGNOSIS — Z302 Encounter for sterilization: Secondary | ICD-10-CM

## 2013-01-15 LAB — CBC
MCH: 29.9 pg (ref 26.0–34.0)
MCHC: 35 g/dL (ref 30.0–36.0)
MCV: 85.4 fL (ref 78.0–100.0)
Platelets: 173 10*3/uL (ref 150–400)
RDW: 13.1 % (ref 11.5–15.5)

## 2013-01-15 LAB — RPR: RPR Ser Ql: NONREACTIVE

## 2013-01-15 LAB — COMPREHENSIVE METABOLIC PANEL
AST: 17 U/L (ref 0–37)
CO2: 21 mEq/L (ref 19–32)
Chloride: 101 mEq/L (ref 96–112)
Creatinine, Ser: 0.56 mg/dL (ref 0.50–1.10)
GFR calc non Af Amer: >90 mL/min (ref >90)
Glucose, Bld: 82 mg/dL (ref 70–99)
Total Bilirubin: 0.4 mg/dL (ref 0.3–1.2)

## 2013-01-15 LAB — PROTEIN / CREATININE RATIO, URINE: Total Protein, Urine: 24 mg/dL

## 2013-01-15 LAB — MRSA PCR SCREENING: MRSA by PCR: NEGATIVE

## 2013-01-15 MED ORDER — LANOLIN HYDROUS EX OINT: TOPICAL_OINTMENT | CUTANEOUS | Status: DC | PRN

## 2013-01-15 MED ORDER — BENZOCAINE-MENTHOL 20-0.5 % EX AERO: 1.0000 "application " | INHALATION_SPRAY | CUTANEOUS | Status: DC | PRN

## 2013-01-15 MED ORDER — OXYCODONE-ACETAMINOPHEN 5-325 MG PO TABS: 1.0000 | ORAL_TABLET | ORAL | Status: DC | PRN

## 2013-01-15 MED ORDER — OXYTOCIN BOLUS FROM INFUSION
500.0000 mL | INTRAVENOUS | Status: DC
  Administered 2013-01-15: 500 mL via INTRAVENOUS

## 2013-01-15 MED ORDER — ACETAMINOPHEN 325 MG PO TABS: 650.0000 mg | ORAL_TABLET | ORAL | Status: DC | PRN

## 2013-01-15 MED ORDER — LACTATED RINGERS IV SOLN: 500.0000 mL | Freq: Once | INTRAVENOUS | Status: DC

## 2013-01-15 MED ORDER — LIDOCAINE HCL (PF) 1 % IJ SOLN
30.0000 mL | INTRAMUSCULAR | Status: DC | PRN
  Filled 2013-01-15 (×2): qty 30

## 2013-01-15 MED ORDER — LABETALOL HCL 5 MG/ML IV SOLN
20.0000 mg | INTRAVENOUS | Status: DC | PRN
  Filled 2013-01-15: qty 4

## 2013-01-15 MED ORDER — CITRIC ACID-SODIUM CITRATE 334-500 MG/5ML PO SOLN: 30.0000 mL | ORAL | Status: DC | PRN

## 2013-01-15 MED ORDER — PHENYLEPHRINE 40 MCG/ML (10ML) SYRINGE FOR IV PUSH (FOR BLOOD PRESSURE SUPPORT)
80.0000 ug | PREFILLED_SYRINGE | INTRAVENOUS | Status: DC | PRN
  Administered 2013-01-15: 80 ug via INTRAVENOUS
  Filled 2013-01-15: qty 2

## 2013-01-15 MED ORDER — IBUPROFEN 600 MG PO TABS
600.0000 mg | ORAL_TABLET | Freq: Four times a day (QID) | ORAL | Status: DC | PRN
  Filled 2013-01-15: qty 1

## 2013-01-15 MED ORDER — ZOLPIDEM TARTRATE 5 MG PO TABS: 5.0000 mg | ORAL_TABLET | Freq: Every evening | ORAL | Status: DC | PRN

## 2013-01-15 MED ORDER — OXYCODONE-ACETAMINOPHEN 5-325 MG PO TABS
1.0000 | ORAL_TABLET | ORAL | Status: DC | PRN
  Administered 2013-01-15 – 2013-01-16 (×3): 1 via ORAL
  Administered 2013-01-16 (×3): 2 via ORAL
  Administered 2013-01-17 (×3): 1 via ORAL
  Filled 2013-01-15 (×2): qty 2
  Filled 2013-01-15 (×3): qty 1
  Filled 2013-01-15: qty 2
  Filled 2013-01-15: qty 1

## 2013-01-15 MED ORDER — SODIUM BICARBONATE 8.4 % IV SOLN
INTRAVENOUS | Status: DC | PRN
  Administered 2013-01-15: 5 mL via EPIDURAL

## 2013-01-15 MED ORDER — ONDANSETRON HCL 4 MG/2ML IJ SOLN: 4.0000 mg | Freq: Four times a day (QID) | INTRAMUSCULAR | Status: DC | PRN

## 2013-01-15 MED ORDER — EPINEPHRINE HCL 0.1 MG/ML IJ SOSY
0.1000 mg | PREFILLED_SYRINGE | Freq: Once | INTRAMUSCULAR | Status: DC
  Filled 2013-01-15: qty 10

## 2013-01-15 MED ORDER — WITCH HAZEL-GLYCERIN EX PADS: 1.0000 "application " | MEDICATED_PAD | CUTANEOUS | Status: DC | PRN

## 2013-01-15 MED ORDER — NALBUPHINE SYRINGE 5 MG/0.5 ML
10.0000 mg | INJECTION | INTRAMUSCULAR | Status: DC | PRN
  Administered 2013-01-15: 10 mg via INTRAVENOUS
  Filled 2013-01-15 (×2): qty 1

## 2013-01-15 MED ORDER — EPHEDRINE 5 MG/ML INJ
10.0000 mg | INTRAVENOUS | Status: DC | PRN
  Filled 2013-01-15: qty 2

## 2013-01-15 MED ORDER — SENNOSIDES-DOCUSATE SODIUM 8.6-50 MG PO TABS
2.0000 | ORAL_TABLET | Freq: Every day | ORAL | Status: DC
  Administered 2013-01-15 – 2013-01-16 (×2): 2 via ORAL

## 2013-01-15 MED ORDER — ONDANSETRON HCL 4 MG/2ML IJ SOLN: 4.0000 mg | INTRAMUSCULAR | Status: DC | PRN

## 2013-01-15 MED ORDER — ONDANSETRON HCL 4 MG PO TABS: 4.0000 mg | ORAL_TABLET | ORAL | Status: DC | PRN

## 2013-01-15 MED ORDER — TETANUS-DIPHTH-ACELL PERTUSSIS 5-2.5-18.5 LF-MCG/0.5 IM SUSP: 0.5000 mL | Freq: Once | INTRAMUSCULAR | Status: DC

## 2013-01-15 MED ORDER — FENTANYL 2.5 MCG/ML BUPIVACAINE 1/10 % EPIDURAL INFUSION (WH - ANES)
14.0000 mL/h | INTRAMUSCULAR | Status: DC | PRN
  Administered 2013-01-15: 14 mL/h via EPIDURAL
  Filled 2013-01-15: qty 125

## 2013-01-15 MED ORDER — PHENYLEPHRINE 40 MCG/ML (10ML) SYRINGE FOR IV PUSH (FOR BLOOD PRESSURE SUPPORT)
80.0000 ug | PREFILLED_SYRINGE | INTRAVENOUS | Status: DC | PRN
  Filled 2013-01-15: qty 2
  Filled 2013-01-15: qty 5

## 2013-01-15 MED ORDER — DIBUCAINE 1 % RE OINT: 1.0000 "application " | TOPICAL_OINTMENT | RECTAL | Status: DC | PRN

## 2013-01-15 MED ORDER — IBUPROFEN 600 MG PO TABS
600.0000 mg | ORAL_TABLET | Freq: Four times a day (QID) | ORAL | Status: DC
  Administered 2013-01-15 – 2013-01-17 (×7): 600 mg via ORAL
  Filled 2013-01-15 (×6): qty 1

## 2013-01-15 MED ORDER — DIPHENHYDRAMINE HCL 25 MG PO CAPS: 25.0000 mg | ORAL_CAPSULE | Freq: Four times a day (QID) | ORAL | Status: DC | PRN

## 2013-01-15 MED ORDER — EPHEDRINE 5 MG/ML INJ
10.0000 mg | INTRAVENOUS | Status: DC | PRN
  Filled 2013-01-15: qty 4
  Filled 2013-01-15: qty 2

## 2013-01-15 MED ORDER — LACTATED RINGERS IV SOLN
INTRAVENOUS | Status: DC
  Administered 2013-01-15: 05:00:00 via INTRAVENOUS

## 2013-01-15 MED ORDER — PRENATAL MULTIVITAMIN CH
1.0000 | ORAL_TABLET | Freq: Every day | ORAL | Status: DC
  Administered 2013-01-15 – 2013-01-16 (×2): 1 via ORAL
  Filled 2013-01-15 (×2): qty 1

## 2013-01-15 MED ORDER — SIMETHICONE 80 MG PO CHEW: 80.0000 mg | CHEWABLE_TABLET | ORAL | Status: DC | PRN

## 2013-01-15 MED ORDER — DIPHENHYDRAMINE HCL 50 MG/ML IJ SOLN: 12.5000 mg | INTRAMUSCULAR | Status: DC | PRN

## 2013-01-15 MED ORDER — OXYTOCIN 40 UNITS IN LACTATED RINGERS INFUSION - SIMPLE MED
62.5000 mL/h | INTRAVENOUS | Status: DC
  Filled 2013-01-15: qty 1000

## 2013-01-15 MED ORDER — LACTATED RINGERS IV SOLN
500.0000 mL | INTRAVENOUS | Status: DC | PRN
  Administered 2013-01-15: 500 mL via INTRAVENOUS

## 2013-01-15 NOTE — Progress Notes (Signed)
 Phenylephrine IVP given for low BP per MD order/protocol

## 2013-01-15 NOTE — Anesthesia Procedure Notes (Signed)
Epidural Patient location during procedure: OB  Preanesthetic Checklist Completed: patient identified, site marked, surgical consent, pre-op evaluation, timeout performed, IV checked, risks and benefits discussed and monitors and equipment checked  Epidural Patient position: sitting Prep: site prepped and draped and DuraPrep Patient monitoring: continuous pulse ox and blood pressure Approach: midline Injection technique: LOR air  Needle:  Needle type: Tuohy  Needle gauge: 17 G Needle length: 9 cm and 9 Needle insertion depth: 7 cm Catheter type: closed end flexible Catheter size: 19 Gauge Catheter at skin depth: 14 cm Test dose: negative  Assessment Events: blood not aspirated, injection not painful, no injection resistance, negative IV test and no paresthesia  Additional Notes Discussed Dura prep vs Chloroprep and Iodine allergy. I recc Duraprep and don't expect a systemic Rxn or Local as it is only ingestion that caused her problems  Dosing of Epidural:  1st dose, through catheter ............................................Marland Kitchen epi 1:200K + Xylocaine 40 mg  2nd dose, through catheter, after waiting 3 minutes...Marland KitchenMarland Kitchenepi 1:200K + Xylocaine 60 mg    ( 2% Xylo charted as a single dose in Epic Meds for ease of charting; actual dosing was fractionated as above, for saftey's sake)  As each dose occurred, patient was free of IV sx; and patient exhibited no evidence of SA injection.  Patient is more comfortable after epidural dosed. Please see RN's note for documentation of vital signs,and FHR which are stable.  Patient reminded not to try to ambulate with numb legs, and that an RN must be present when she attempts to get up.

## 2013-01-15 NOTE — H&P (Signed)
[name] is a 27 y.o. female presenting for contractions starting Sunday (2 days ago) and becoming stronger and more frequent. Seen earlier last night with contractions and same cervical exam. However, also now having elevated blood pressures.  Receives care at [facility] Department. 1 hour GTT 86, normal quad screen, normal ultrasounds. Dating by LMP (01-15-13), by 6 week ultrasound EDD 01-20-13 would make her 39.2.  Has had elevated blood pressures on several occasions during pregnancy: (135/94 on 1/27, 143/93 on 2/24, 129/90 on 5/29, 137/95 on 6/19).  Denies headache, vision changes, RUQ pain. Pt states that her BP was high when taken with automated machine and normal with manual checks.   Maternal Medical History:  Reason for admission: Contractions.   Contractions: Onset was 2 days ago.   Frequency: regular.   Perceived severity is strong.    Fetal activity: Perceived fetal activity is normal.   Last perceived fetal movement was within the past hour.    Prenatal complications: no prenatal complications Prenatal Complications - Diabetes: none.    OB History   Grav Para Term Preterm Abortions TAB SAB Ect Mult Living   4 3 3       3      Past Medical History  Diagnosis Date  . Obesity   . Asthma   . Headache(784.0)   . Pregnancy induced hypertension   . Infection     Urinary tract infection  . Back pain     herniated disk  . Abnormal Pap smear     follow up ok   Past Surgical History  Procedure Laterality Date  . Tonsilectomy, adenoidectomy, bilateral myringotomy and tubes     Family History: family history is negative for Other. Social History:  reports that she has quit smoking. Her smoking use included Cigarettes. She smoked 0.00 packs per day. She has never used smokeless tobacco. She reports that she does not drink alcohol or use illicit drugs.   Prenatal Transfer Tool  Maternal Diabetes: No Genetic Screening: Normal Maternal Ultrasounds/Referrals:  Normal Fetal Ultrasounds or other Referrals:  None Maternal Substance Abuse:  Yes:  Type: Smoker, Other: (in early pregnancy Significant Maternal Medications:  None Significant Maternal Lab Results:  Lab values include: Group B Strep negative Other Comments:  None  ROS See HPI   Exam Filed Vitals:   01/15/13 0344 01/15/13 0421  BP: 146/105 131/91  Pulse: 90 83    Physical Exam   GEN:  WNWD, no distress HEENT:  NCAT, EOMI, conjunctiva clear NECK:  Supple, non-tender, no thyromegaly, trachea midline CV: RRR, no murmur RESP:  CTAB ABD:  Soft, non-tender, no guarding or rebound, normal bowel sounds EXTREM:  Warm, well perfused, no edema or tenderness NEURO:  Alert, oriented, no focal deficits  Dilation: 1 Effacement (%): 50 Cervical Position: Posterior Station: -2 Presentation: Vertex Exam by:: DCALLAWAY, RN    Prenatal labs: ABO, Rh:   Antibody: Negative, Negative (12/31 0000) Rubella: Immune (12/31 0000) RPR: Nonreactive (12/31 0000)  HBsAg: Negative, Negative (12/31 0000)  HIV: Non-reactive, Non-reactive, Non-reactive (12/31 0000)  GBS: Negative (06/20 0000)   Assessment/Plan: 27 y.o. G4P3003 at [redacted]w[redacted]d with early labor, GHTN - Admit to L&D with GHTN given full-term and contractions - PIH labs, mag if any severe features - Will use pitocin as needed to augment labor. - GBS negative -  Epidural when desired - Anticipate SVD - Desires BTL and papers signed > 30 days ago  Napoleon Form 01/15/2013, 4:26 AM

## 2013-01-15 NOTE — MAU Note (Signed)
Worsening contractions.  

## 2013-01-15 NOTE — Anesthesia Postprocedure Evaluation (Signed)
Anesthesia Post Note  Patient: [name]  Procedure(s) Performed: * No procedures listed *  Anesthesia type: Epidural  Patient location: Mother/Baby  Post pain: Pain level controlled  Post assessment: Post-op Vital signs reviewed  Last Vitals:  Filed Vitals:   01/15/13 1315  BP: 122/78  Pulse: 86  Temp: 36.6 C  Resp: 20    Post vital signs: Reviewed  Level of consciousness:alert  Complications: No apparent anesthesia complications

## 2013-01-15 NOTE — Anesthesia Preprocedure Evaluation (Addendum)
Anesthesia Evaluation  Patient identified by MRN, date of birth, ID band Patient awake    Reviewed: Allergy & Precautions, H&P , Patient's Chart, lab work & pertinent test results  Airway Mallampati: II  TM Distance: >3 FB Neck ROM: full    Dental  (+) Teeth Intact   Pulmonary asthma ,  breath sounds clear to auscultation        Cardiovascular hypertension, Rhythm:regular Rate:Normal     Neuro/Psych    GI/Hepatic   Endo/Other  Morbid obesity  Renal/GU      Musculoskeletal   Abdominal   Peds  Hematology   Anesthesia Other Findings       Reproductive/Obstetrics (+) Pregnancy                             Anesthesia Physical Anesthesia Plan  ASA: III  Anesthesia Plan: Epidural   Post-op Pain Management:    Induction:   Airway Management Planned:   Additional Equipment:   Intra-op Plan:   Post-operative Plan:   Informed Consent: I have reviewed the patients History and Physical, chart, labs and discussed the procedure including the risks, benefits and alternatives for the proposed anesthesia with the patient or authorized representative who has indicated his/her understanding and acceptance.   Dental Advisory Given  Plan Discussed with:   Anesthesia Plan Comments: (Labs checked- platelets confirmed with RN in room. Fetal heart tracing, per RN, reported to be stable enough for sitting procedure. Discussed epidural, and patient consents to the procedure:  included risk of possible headache,backache, failed block, allergic reaction, and nerve injury. This patient was asked if she had any questions or concerns before the procedure started.)        Anesthesia Quick Evaluation  

## 2013-01-16 ENCOUNTER — Inpatient Hospital Stay (HOSPITAL_COMMUNITY): Payer: Medicaid Other | Admitting: Anesthesiology

## 2013-01-16 ENCOUNTER — Encounter (HOSPITAL_COMMUNITY)
Admission: AD | Disposition: A | Discharge status: Home / Self Care | Payer: Self-pay | Source: Ambulatory Visit | Attending: Obstetrics & Gynecology

## 2013-01-16 ENCOUNTER — Encounter (HOSPITAL_COMMUNITY): Payer: Self-pay | Admitting: Anesthesiology

## 2013-01-16 DIAGNOSIS — Z302 Encounter for sterilization: Secondary | ICD-10-CM

## 2013-01-16 HISTORY — PX: TUBAL LIGATION: SHX77

## 2013-01-16 SURGERY — LIGATION, FALLOPIAN TUBE, POSTPARTUM
Anesthesia: Epidural | Site: Abdomen | Laterality: Bilateral | Wound class: Clean

## 2013-01-16 MED ORDER — BUPIVACAINE HCL (PF) 0.25 % IJ SOLN
INTRAMUSCULAR | Status: DC | PRN
  Administered 2013-01-16: 30 mL

## 2013-01-16 MED ORDER — PROMETHAZINE HCL 25 MG/ML IJ SOLN: 6.2500 mg | INTRAMUSCULAR | Status: DC | PRN

## 2013-01-16 MED ORDER — KETOROLAC TROMETHAMINE 30 MG/ML IJ SOLN
INTRAMUSCULAR | Status: DC | PRN
  Administered 2013-01-16: 30 mg via INTRAVENOUS

## 2013-01-16 MED ORDER — LIDOCAINE-EPINEPHRINE (PF) 2 %-1:200000 IJ SOLN
INTRAMUSCULAR | Status: AC
  Filled 2013-01-16: qty 40

## 2013-01-16 MED ORDER — ONDANSETRON HCL 4 MG/2ML IJ SOLN
INTRAMUSCULAR | Status: AC
  Filled 2013-01-16: qty 2

## 2013-01-16 MED ORDER — SODIUM BICARBONATE 8.4 % IV SOLN
INTRAVENOUS | Status: AC
  Filled 2013-01-16: qty 50

## 2013-01-16 MED ORDER — FENTANYL CITRATE 0.05 MG/ML IJ SOLN
INTRAMUSCULAR | Status: DC | PRN
  Administered 2013-01-16: 50 ug via INTRAVENOUS
  Administered 2013-01-16 (×2): 25 ug via INTRAVENOUS
  Administered 2013-01-16: 50 ug via INTRAVENOUS

## 2013-01-16 MED ORDER — FENTANYL CITRATE 0.05 MG/ML IJ SOLN
INTRAMUSCULAR | Status: AC
  Filled 2013-01-16: qty 5

## 2013-01-16 MED ORDER — MIDAZOLAM HCL 2 MG/2ML IJ SOLN
INTRAMUSCULAR | Status: AC
  Filled 2013-01-16: qty 2

## 2013-01-16 MED ORDER — ONDANSETRON HCL 4 MG/2ML IJ SOLN
INTRAMUSCULAR | Status: DC | PRN
  Administered 2013-01-16: 4 mg via INTRAVENOUS

## 2013-01-16 MED ORDER — METOCLOPRAMIDE HCL 10 MG PO TABS
10.0000 mg | ORAL_TABLET | Freq: Once | ORAL | Status: AC
  Administered 2013-01-16: 10 mg via ORAL
  Filled 2013-01-16: qty 1

## 2013-01-16 MED ORDER — FENTANYL CITRATE 0.05 MG/ML IJ SOLN: 25.0000 ug | INTRAMUSCULAR | Status: DC | PRN

## 2013-01-16 MED ORDER — FAMOTIDINE 20 MG PO TABS
40.0000 mg | ORAL_TABLET | Freq: Once | ORAL | Status: AC
  Administered 2013-01-16: 40 mg via ORAL
  Filled 2013-01-16 (×2): qty 1

## 2013-01-16 MED ORDER — MIDAZOLAM HCL 2 MG/2ML IJ SOLN: 0.5000 mg | Freq: Once | INTRAMUSCULAR | Status: DC | PRN

## 2013-01-16 MED ORDER — KETOROLAC TROMETHAMINE 30 MG/ML IJ SOLN: 15.0000 mg | Freq: Once | INTRAMUSCULAR | Status: AC | PRN

## 2013-01-16 MED ORDER — MIDAZOLAM HCL 5 MG/5ML IJ SOLN
INTRAMUSCULAR | Status: DC | PRN
  Administered 2013-01-16 (×2): 1 mg via INTRAVENOUS

## 2013-01-16 MED ORDER — MEPERIDINE HCL 25 MG/ML IJ SOLN: 6.2500 mg | INTRAMUSCULAR | Status: DC | PRN

## 2013-01-16 MED ORDER — 0.9 % SODIUM CHLORIDE (POUR BTL) OPTIME
TOPICAL | Status: DC | PRN
  Administered 2013-01-16: 1000 mL

## 2013-01-16 MED ORDER — SODIUM BICARBONATE 8.4 % IV SOLN
INTRAVENOUS | Status: DC | PRN
  Administered 2013-01-16: 5 mL via EPIDURAL

## 2013-01-16 MED ORDER — LACTATED RINGERS IV SOLN
INTRAVENOUS | Status: DC
  Administered 2013-01-16: 500 mL via INTRAVENOUS
  Administered 2013-01-16: 15:00:00 via INTRAVENOUS

## 2013-01-16 MED ORDER — KETOROLAC TROMETHAMINE 30 MG/ML IJ SOLN
INTRAMUSCULAR | Status: AC
  Filled 2013-01-16: qty 1

## 2013-01-16 MED ORDER — BUPIVACAINE HCL (PF) 0.25 % IJ SOLN
INTRAMUSCULAR | Status: AC
  Filled 2013-01-16: qty 30

## 2013-01-16 SURGICAL SUPPLY — 21 items
BLADE SURG 11 STRL SS (BLADE) ×2 IMPLANT
BNDG ADH 5X3 H2O RPLNT NS (GAUZE/BANDAGES/DRESSINGS) ×1
BNDG COHESIVE 3X5 WHT NS (GAUZE/BANDAGES/DRESSINGS) ×2 IMPLANT
CHLORAPREP W/TINT 26ML (MISCELLANEOUS) ×2 IMPLANT
CLIP FILSHIE TUBAL LIGA STRL (Clip) ×2 IMPLANT
CLOTH BEACON ORANGE TIMEOUT ST (SAFETY) ×2 IMPLANT
GLOVE BIO SURGEON STRL SZ 6.5 (GLOVE) ×2 IMPLANT
GLOVE BIOGEL PI IND STRL 7.0 (GLOVE) ×1 IMPLANT
GLOVE BIOGEL PI INDICATOR 7.0 (GLOVE) ×1
GOWN PREVENTION PLUS LG XLONG (DISPOSABLE) ×4 IMPLANT
NEEDLE HYPO 25X1 1.5 SAFETY (NEEDLE) ×2 IMPLANT
NS IRRIG 1000ML POUR BTL (IV SOLUTION) ×2 IMPLANT
PACK ABDOMINAL MINOR (CUSTOM PROCEDURE TRAY) ×2 IMPLANT
SPONGE LAP 4X18 X RAY DECT (DISPOSABLE) IMPLANT
SUT VIC AB 0 CT1 27 (SUTURE) ×2
SUT VIC AB 0 CT1 27XBRD ANBCTR (SUTURE) ×1 IMPLANT
SUT VICRYL 4-0 PS2 18IN ABS (SUTURE) ×2 IMPLANT
SYR CONTROL 10ML LL (SYRINGE) ×2 IMPLANT
TOWEL OR 17X24 6PK STRL BLUE (TOWEL DISPOSABLE) ×4 IMPLANT
TRAY FOLEY BAG SILVER LF 14FR (CATHETERS) ×2 IMPLANT
WATER STERILE IRR 1000ML POUR (IV SOLUTION) ×2 IMPLANT

## 2013-01-16 NOTE — H&P (Signed)
Agree with note  Adam Phenix, MD .

## 2013-01-16 NOTE — Progress Notes (Signed)
Post Partum Day 1 Subjective: Reports back pain, she thinkd from epidural.  Objective: Blood pressure 138/88, pulse 92, temperature 98.1 F (36.7 C), temperature source Oral, resp. rate 20, height 5\' 4"  (1.626 m), weight 290 lb (131.543 kg), last menstrual period 04/10/2012, SpO2 98.00%, unknown if currently breastfeeding.  Physical Exam:  General: alert, cooperative and appears stated age Lochia: appropriate Uterine Fundus: firm DVT Evaluation: No evidence of DVT seen on physical exam.   Recent Labs  01/15/13 0510  HGB 12.1  HCT 34.6*    Assessment/Plan: Plan for discharge tomorrow and Contraception for pp BTL. Bottle feeding. Patient counseled, r.e. Risks benefits of BTL, including permanency of procedure, risk of failure(1:100), increased risk of ectopic.  Patient verbalized understanding and desires to proceed   LOS: 1 day   Holdyn Poyser S 01/16/2013, 7:15 AM

## 2013-01-16 NOTE — Progress Notes (Signed)
UR chart review completed.  

## 2013-01-16 NOTE — Anesthesia Postprocedure Evaluation (Signed)
Anesthesia Post Note  Patient: [name]  Procedure(s) Performed: Procedure(s) (LRB): POST PARTUM TUBAL LIGATION (Bilateral)  Anesthesia type: Epidural  Patient location: PACU  Post pain: Pain level controlled  Post assessment: Post-op Vital signs reviewed  Last Vitals:  Filed Vitals:   01/16/13 1530  BP: 128/76  Pulse: 96  Temp: 36.6 C  Resp: 21    Post vital signs: Reviewed  Level of consciousness: awake  Complications: No apparent anesthesia complications

## 2013-01-16 NOTE — Op Note (Signed)
[name] 01/15/2013 - 01/16/2013  PREOPERATIVE DIAGNOSIS:  Multiparity, undesired fertility  POSTOPERATIVE DIAGNOSIS:  Multiparity, undesired fertility  PROCEDURE:  Postpartum Bilateral Tubal Sterilization using Filshie Clips   ANESTHESIA:  Epidural  COMPLICATIONS:  None immediate.  ESTIMATED BLOOD LOSS:  Less than 20 ml.  FLUIDS: 500 ml LR.  URINE OUTPUT:  30 ml of clear urine.  INDICATIONS: 27 y.o. [id number]  with undesired fertility,status post vaginal delivery, desires permanent sterilization. Risks and benefits of procedure discussed with patient including permanence of method, bleeding, infection, injury to surrounding organs and need for additional procedures. Risk failure of 0.5-1% with increased risk of ectopic gestation if pregnancy occurs was also discussed with patient.   FINDINGS:  Normal uterus, tubes, and ovaries.  TECHNIQUE:  The patient was taken to the operating room where her epidural anesthesia was dosed up to surgical level and found to be adequate.  She was then placed in the dorsal supine position and prepped and draped in sterile fashion.  After an adequate timeout was performed, attention was turned to the patient's abdomen where a small transverse skin incision was made under the umbilical fold. The incision was taken down to the layer of fascia using the scalpel, and fascia was incised, and extended bilaterally using Mayo scissors. The peritoneum was entered in a sharp fashion. Attention was then turned to the patient's uterus, and left fallopian tube was identified and followed out to the fimbriated end.  A Filshie clip was placed on the left fallopian tube about 2 cm from the cornual attachment, with care given to incorporate the underlying mesosalpinx.  A similar process was carried out on the right side allowing for bilateral tubal sterilization.  Good hemostasis was noted overall.  Local analgesia was drizzled on both operative sites.The instruments were  then removed from the patient's abdomen and the fascial incision was repaired with 0 Vicryl, and the skin was closed with a 4-0 Vicryl subcuticular stitch. The patient tolerated the procedure well.  Sponge, lap, and needle counts were correct times two.  The patient was then taken to the recovery room in stable condition.  Adam Phenix, MD 01/16/2013 2:37 PM

## 2013-01-16 NOTE — Anesthesia Preprocedure Evaluation (Signed)
Anesthesia Evaluation  Patient identified by MRN, date of birth, ID band Patient awake    Reviewed: Allergy & Precautions, H&P , Patient's Chart, lab work & pertinent test results  Airway Mallampati: II TM Distance: >3 FB Neck ROM: full    Dental  (+) Teeth Intact   Pulmonary asthma ,  breath sounds clear to auscultation        Cardiovascular hypertension, Rhythm:regular Rate:Normal     Neuro/Psych  Headaches,    GI/Hepatic   Endo/Other  Morbid obesity  Renal/GU      Musculoskeletal   Abdominal   Peds  Hematology   Anesthesia Other Findings       Reproductive/Obstetrics                           Anesthesia Physical  Anesthesia Plan  ASA: III  Anesthesia Plan: Epidural   Post-op Pain Management:    Induction:   Airway Management Planned:   Additional Equipment:   Intra-op Plan:   Post-operative Plan:   Informed Consent: I have reviewed the patients History and Physical, chart, labs and discussed the procedure including the risks, benefits and alternatives for the proposed anesthesia with the patient or authorized representative who has indicated his/her understanding and acceptance.   Dental Advisory Given  Plan Discussed with:   Anesthesia Plan Comments: (Labs checked- platelets confirmed with RN in room. Fetal heart tracing, per RN, reported to be stable enough for sitting procedure. Discussed epidural, and patient consents to the procedure:  included risk of possible headache,backache, failed block, allergic reaction, and nerve injury. This patient was asked if she had any questions or concerns before the procedure started. )        Anesthesia Quick Evaluation

## 2013-01-16 NOTE — Transfer of Care (Signed)
Immediate Anesthesia Transfer of Care Note  Patient: [name]  Procedure(s) Performed: Procedure(s): POST PARTUM TUBAL LIGATION (Bilateral)  Patient Location: PACU  Anesthesia Type:Epidural  Level of Consciousness: awake, alert  and oriented  Airway & Oxygen Therapy: Patient Spontanous Breathing  Post-op Assessment: Report given to PACU RN and Post -op Vital signs reviewed and stable  Post vital signs: stable  Complications: No apparent anesthesia complications

## 2013-01-17 ENCOUNTER — Encounter (HOSPITAL_COMMUNITY): Payer: Self-pay | Admitting: Obstetrics & Gynecology

## 2013-01-17 DIAGNOSIS — O0940 Supervision of pregnancy with grand multiparity, unspecified trimester: Secondary | ICD-10-CM

## 2013-01-17 DIAGNOSIS — O139 Gestational [pregnancy-induced] hypertension without significant proteinuria, unspecified trimester: Secondary | ICD-10-CM

## 2013-01-17 MED ORDER — IBUPROFEN 600 MG PO TABS: 600.0000 mg | ORAL_TABLET | Freq: Four times a day (QID) | ORAL | Status: DC

## 2013-01-17 MED ORDER — OXYCODONE-ACETAMINOPHEN 5-325 MG PO TABS: 1.0000 | ORAL_TABLET | ORAL | Status: DC | PRN

## 2013-01-17 NOTE — Discharge Summary (Signed)
Obstetric Discharge Summary Reason for Admission: onset of labor Delivery Note On 01/15/13: At 10:57 AM a viable and healthy female was delivered via Vaginal, Spontaneous Delivery (Presentation: Left Occiput Anterior). APGAR: 8, 9; weight .  Placenta status: Intact, Spontaneous. Cord: 3 vessels with the following complications: None.  Anesthesia: Epidural  Episiotomy: None  Lacerations: 1st degree;Labial  Suture Repair: n/a  Est. Blood Loss (mL): 100  Mom to postpartum. Baby to with mother.   Prenatal Procedures: ultrasound Intrapartum Procedures: spontaneous vaginal delivery Postpartum Procedures: none Complications-Operative and Postpartum: 1st degree perineal laceration Hemoglobin  Date Value Range Status  01/15/2013 12.1  12.0 - 15.0 g/dL Final     HCT  Date Value Range Status  01/15/2013 34.6* 36.0 - 46.0 % Final    Physical Exam:  General: alert, cooperative and no distress Lochia: appropriate, small amount Uterine Fundus: firm, -2 Incision: healing well, no significant drainage DVT Evaluation: No evidence of DVT seen on physical exam. Negative Homan's sign. No cords or calf tenderness. No significant calf/ankle edema.  Discharge Diagnoses: Term Pregnancy-delivered  Discharge Information: Date: 01/17/2013 Activity: pelvic rest Diet: routine Medications: Ibuprofen and Percocet Condition: stable Instructions: refer to practice specific booklet Discharge to: home Follow-up Information   Follow up with [facility] [place]. Schedule an appointment as soon as possible for a visit in 5 weeks.   Contact information:   [address] [place] [address] [phone]      Newborn Data: Live born female  Birth Weight: 7 lb 13.6 oz (3561 g) APGAR: 8, 9  Home with mother.  LEFTWICH-KIRBY, Kaysie Michelini 01/17/2013, 7:27 AM

## 2013-01-17 NOTE — Discharge Summary (Signed)
Attestation of Attending Supervision of Advanced Practitioner (CNM/NP): Evaluation and management procedures were performed by the Advanced Practitioner under my supervision and collaboration. I have reviewed the Advanced Practitioner's note and chart, and I agree with the management and plan.  Marella Vanderpol H. 10:41 AM   

## 2013-01-17 NOTE — Anesthesia Postprocedure Evaluation (Signed)
  Anesthesia Post-op Note  Patient: [name]  Procedure(s) Performed: Procedure(s): POST PARTUM TUBAL LIGATION (Bilateral)  Patient Location: Mother/Baby  Anesthesia Type:Epidural  Level of Consciousness: awake, alert  and oriented  Airway and Oxygen Therapy: Patient Spontanous Breathing  Post-op Pain: none  Post-op Assessment: Post-op Vital signs reviewed, Patient's Cardiovascular Status Stable, No headache, No backache, No residual numbness and No residual motor weakness  Post-op Vital Signs: Reviewed and stable  Complications: No apparent anesthesia complications

## 2013-01-24 ENCOUNTER — Encounter: Payer: Self-pay | Admitting: *Deleted

## 2013-03-06 ENCOUNTER — Encounter: Payer: Self-pay | Admitting: *Deleted

## 2013-03-29 ENCOUNTER — Emergency Department (HOSPITAL_COMMUNITY)
Admission: EM | Admit: 2013-03-29 | Discharge: 2013-03-29 | Disposition: A | Discharge status: Home / Self Care | Payer: Medicaid Other | Attending: Emergency Medicine | Admitting: Emergency Medicine

## 2013-03-29 ENCOUNTER — Encounter (HOSPITAL_COMMUNITY): Payer: Self-pay | Admitting: *Deleted

## 2013-03-29 DIAGNOSIS — Z87440 Personal history of urinary (tract) infections: Secondary | ICD-10-CM | POA: Insufficient documentation

## 2013-03-29 DIAGNOSIS — Z87891 Personal history of nicotine dependence: Secondary | ICD-10-CM | POA: Insufficient documentation

## 2013-03-29 DIAGNOSIS — S30860A Insect bite (nonvenomous) of lower back and pelvis, initial encounter: Secondary | ICD-10-CM | POA: Insufficient documentation

## 2013-03-29 DIAGNOSIS — Y939 Activity, unspecified: Secondary | ICD-10-CM | POA: Insufficient documentation

## 2013-03-29 DIAGNOSIS — W57XXXA Bitten or stung by nonvenomous insect and other nonvenomous arthropods, initial encounter: Secondary | ICD-10-CM | POA: Insufficient documentation

## 2013-03-29 DIAGNOSIS — J45909 Unspecified asthma, uncomplicated: Secondary | ICD-10-CM | POA: Insufficient documentation

## 2013-03-29 DIAGNOSIS — S20462A Insect bite (nonvenomous) of left back wall of thorax, initial encounter: Secondary | ICD-10-CM

## 2013-03-29 DIAGNOSIS — Y929 Unspecified place or not applicable: Secondary | ICD-10-CM | POA: Insufficient documentation

## 2013-03-29 DIAGNOSIS — Z8742 Personal history of other diseases of the female genital tract: Secondary | ICD-10-CM | POA: Insufficient documentation

## 2013-03-29 DIAGNOSIS — E669 Obesity, unspecified: Secondary | ICD-10-CM | POA: Insufficient documentation

## 2013-03-29 MED ORDER — DIPHENHYDRAMINE HCL 25 MG PO TABS: 25.0000 mg | ORAL_TABLET | Freq: Four times a day (QID) | ORAL | Status: DC

## 2013-03-29 MED ORDER — DIPHENHYDRAMINE HCL 25 MG PO CAPS
50.0000 mg | ORAL_CAPSULE | Freq: Once | ORAL | Status: AC
  Administered 2013-03-29: 50 mg via ORAL
  Filled 2013-03-29: qty 2

## 2013-03-29 NOTE — ED Notes (Signed)
The pt was bitten by something  On her rt posterior chest and she has pain and  Numbness in that arm from the bites.  She thinks its a spider bite

## 2013-03-29 NOTE — ED Provider Notes (Signed)
CSN: [id number]     Arrival date & time 03/29/13  1605 History  This chart was scribed for non-physician practitioner, Fayrene Helper, PA-C, working with Gwyneth Sprout, MD by Ronal Fear, ED scribe. This patient was seen in room TR07C/TR07C and the patient's care was started at 6:13 PM.     Chief Complaint  Patient presents with  . Insect Bite    Patient is a 27 y.o. female presenting with animal bite. The history is provided by the patient. No language interpreter was used.  Animal Bite Contact animal:  Insect Location:  Torso Time since incident:  1 day Pain details:    Quality:  Itching   Severity:  Mild   Timing:  Constant   Progression:  Unchanged Incident location:  Outside Provoked: unprovoked   Notifications:  None Relieved by:  None tried Worsened by:  Nothing tried Ineffective treatments:  None tried Associated symptoms: swelling   Associated symptoms: no fever, no numbness and no rash   Pt was bitten on her right mid back in 2 places yesterday afternoon that presents as swelling. Pt has tingling in her right arm. She is allergic to iodine and shellfish,  Pt is currently not breast feeding.  Past Medical History  Diagnosis Date  . Obesity   . Asthma   . Headache(784.0)   . Pregnancy induced hypertension   . Infection     Urinary tract infection  . Back pain     herniated disk  . Abnormal Pap smear     follow up ok   Past Surgical History  Procedure Laterality Date  . Tonsilectomy, adenoidectomy, bilateral myringotomy and tubes    . Tubal ligation Bilateral 01/16/2013    Procedure: POST PARTUM TUBAL LIGATION;  Surgeon: Adam Phenix, MD;  Location: [facility];  Service: Gynecology;  Laterality: Bilateral;   Family History  Problem Relation Age of Onset  . Other Neg Hx    History  Substance Use Topics  . Smoking status: Former Smoker    Types: Cigarettes  . Smokeless tobacco: Never Used     Comment: quit with pos preg  . Alcohol Use: No   OB History    Grav Para Term Preterm Abortions TAB SAB Ect Mult Living   4 4 4       4      Review of Systems  Constitutional: Negative for fever and chills.  Gastrointestinal: Negative for nausea and vomiting.  Skin: Negative for rash.       Insect bites  Allergic/Immunologic: Positive for environmental allergies.  Neurological: Negative for numbness.    Allergies  Iodine and Shellfish allergy  Home Medications  No current outpatient prescriptions on file. BP 127/84  Pulse 93  Temp(Src) 98.2 F (36.8 C) (Oral)  Ht 5' 3.5" (1.613 m)  Wt 261 lb 7 oz (118.587 kg)  BMI 45.58 kg/m2  SpO2 98% Physical Exam  Nursing note and vitals reviewed. Constitutional: She is oriented to person, place, and time. She appears well-developed and well-nourished. No distress.  HENT:  Head: Normocephalic and atraumatic.  Eyes: EOM are normal.  Neck: Normal range of motion. Neck supple. No tracheal deviation present.  Cardiovascular: Normal rate.   Pulmonary/Chest: Effort normal. No respiratory distress.  Abdominal: Soft. There is no tenderness.  Musculoskeletal: Normal range of motion.  Lymphadenopathy:    She has no cervical adenopathy.  Neurological: She is alert and oriented to person, place, and time.  normakl strength and sensation. Tingling sensation to right arm  but no true numbness   Skin: Skin is warm and dry. No rash noted. There is erythema.  Right upper back localized skin irritation induration and erythema. 1 cm in diameter. 2 localized skin irritations to right mid back. No abscess. no petechia. no pustule. no vesicular lesion  Psychiatric: She has a normal mood and affect. Her behavior is normal.    ED Course  Procedures (including critical care time)  DIAGNOSTIC STUDIES: Oxygen Saturation is 98% on RA, normal by my interpretation.    COORDINATION OF CARE: 6:17 PM- Pt advised of plan for treatment including benadryl and hydrocortisone cream, pt expresses understanding and  agrees.   Labs Review Labs Reviewed - No data to display Imaging Review No results found.  MDM   1. Nonvenomous insect bite of back without infection, left, initial encounter    BP 127/84  Pulse 93  Temp(Src) 98.2 F (36.8 C) (Oral)  Ht 5' 3.5" (1.613 m)  Wt 261 lb 7 oz (118.587 kg)  BMI 45.58 kg/m2  SpO2 98%   I personally performed the services described in this documentation, which was scribed in my presence. The recorded information has been reviewed and is accurate.     Fayrene Helper, PA-C 03/29/13 1828

## 2013-03-30 NOTE — ED Provider Notes (Signed)
Medical screening examination/treatment/procedure(s) were performed by non-physician practitioner and as supervising physician I was immediately available for consultation/collaboration.    Gwyneth Sprout, MD 03/30/13 [phone]

## 2013-04-22 ENCOUNTER — Emergency Department (HOSPITAL_COMMUNITY)
Admission: EM | Admit: 2013-04-22 | Discharge: 2013-04-22 | Disposition: A | Discharge status: Home / Self Care | Payer: Medicaid Other | Attending: Emergency Medicine | Admitting: Emergency Medicine

## 2013-04-22 ENCOUNTER — Encounter (HOSPITAL_COMMUNITY): Payer: Self-pay | Admitting: Emergency Medicine

## 2013-04-22 ENCOUNTER — Emergency Department (HOSPITAL_COMMUNITY): Payer: Medicaid Other

## 2013-04-22 DIAGNOSIS — Z8742 Personal history of other diseases of the female genital tract: Secondary | ICD-10-CM | POA: Insufficient documentation

## 2013-04-22 DIAGNOSIS — Z79899 Other long term (current) drug therapy: Secondary | ICD-10-CM | POA: Insufficient documentation

## 2013-04-22 DIAGNOSIS — Z8669 Personal history of other diseases of the nervous system and sense organs: Secondary | ICD-10-CM | POA: Insufficient documentation

## 2013-04-22 DIAGNOSIS — E669 Obesity, unspecified: Secondary | ICD-10-CM | POA: Insufficient documentation

## 2013-04-22 DIAGNOSIS — R079 Chest pain, unspecified: Secondary | ICD-10-CM | POA: Insufficient documentation

## 2013-04-22 DIAGNOSIS — J189 Pneumonia, unspecified organism: Secondary | ICD-10-CM | POA: Insufficient documentation

## 2013-04-22 DIAGNOSIS — J45909 Unspecified asthma, uncomplicated: Secondary | ICD-10-CM | POA: Insufficient documentation

## 2013-04-22 DIAGNOSIS — Z87891 Personal history of nicotine dependence: Secondary | ICD-10-CM | POA: Insufficient documentation

## 2013-04-22 DIAGNOSIS — R0602 Shortness of breath: Secondary | ICD-10-CM | POA: Insufficient documentation

## 2013-04-22 MED ORDER — ALBUTEROL SULFATE (5 MG/ML) 0.5% IN NEBU
2.5000 mg | INHALATION_SOLUTION | Freq: Once | RESPIRATORY_TRACT | Status: AC
  Administered 2013-04-22: 2.5 mg via RESPIRATORY_TRACT
  Filled 2013-04-22: qty 0.5

## 2013-04-22 MED ORDER — AZITHROMYCIN 250 MG PO TABS: 250.0000 mg | ORAL_TABLET | Freq: Every day | ORAL | Status: DC

## 2013-04-22 MED ORDER — PREDNISONE 20 MG PO TABS: 40.0000 mg | ORAL_TABLET | Freq: Every day | ORAL | Status: DC

## 2013-04-22 MED ORDER — PREDNISONE 20 MG PO TABS
60.0000 mg | ORAL_TABLET | Freq: Once | ORAL | Status: AC
  Administered 2013-04-22: 60 mg via ORAL
  Filled 2013-04-22: qty 3

## 2013-04-22 MED ORDER — IPRATROPIUM BROMIDE 0.02 % IN SOLN
0.5000 mg | Freq: Once | RESPIRATORY_TRACT | Status: AC
  Administered 2013-04-22: 0.5 mg via RESPIRATORY_TRACT
  Filled 2013-04-22: qty 2.5

## 2013-04-22 MED ORDER — ALBUTEROL SULFATE HFA 108 (90 BASE) MCG/ACT IN AERS
2.0000 | INHALATION_SPRAY | Freq: Once | RESPIRATORY_TRACT | Status: AC
  Administered 2013-04-22: 2 via RESPIRATORY_TRACT
  Filled 2013-04-22: qty 6.7

## 2013-04-22 NOTE — ED Notes (Signed)
Pt c/o URI sx with productive cough with yellow sputum; pt sts x 3 days; pt sts pain with cough and inspiration and head congestion

## 2013-04-22 NOTE — ED Provider Notes (Signed)
CSN: [id number]     Arrival date & time 04/22/13  1806 History  This chart was scribed for non-physician practitioner, Antony Madura, PA-C working with Hilario Quarry, MD by Greggory Stallion, ED scribe. This patient was seen in room TR08C/TR08C and the patient's care was started at 8:39 PM.   Chief Complaint  Patient presents with  . URI  . Cough   The history is provided by the patient. No language interpreter was used.   HPI Comments: [name] is a 27 y.o. female who presents to the Emergency Department complaining of productive cough of yellow sputum, congestion, rhinorrhea that started 2 days ago. She feels SOB while exerting herself. Pt states inhalation causing pain. She denies fever, abdominal pain, nausea, emesis. Pt states her children were sick but they are better now.   Past Medical History  Diagnosis Date  . Obesity   . Asthma   . Headache(784.0)   . Pregnancy induced hypertension   . Infection     Urinary tract infection  . Back pain     herniated disk  . Abnormal Pap smear     follow up ok   Past Surgical History  Procedure Laterality Date  . Tonsilectomy, adenoidectomy, bilateral myringotomy and tubes    . Tubal ligation Bilateral 01/16/2013    Procedure: POST PARTUM TUBAL LIGATION;  Surgeon: Adam Phenix, MD;  Location: [facility];  Service: Gynecology;  Laterality: Bilateral;   Family History  Problem Relation Age of Onset  . Other Neg Hx    History  Substance Use Topics  . Smoking status: Former Smoker    Types: Cigarettes  . Smokeless tobacco: Never Used     Comment: quit with pos preg  . Alcohol Use: No   OB History   Grav Para Term Preterm Abortions TAB SAB Ect Mult Living   4 4 4       4      Review of Systems  Constitutional: Negative for fever.  HENT: Positive for congestion and rhinorrhea.   Respiratory: Positive for cough and shortness of breath.   Gastrointestinal: Negative for nausea, vomiting and abdominal pain.  All other systems  reviewed and are negative.    Allergies  Iodine and Shellfish allergy  Home Medications   Current Outpatient Rx  Name  Route  Sig  Dispense  Refill  . EPINEPHrine (EPIPEN) 0.3 mg/0.3 mL SOAJ injection   Intramuscular   Inject 0.3 mg into the muscle daily as needed (Anaphylaxis).         . Pseudoephedrine-APAP-DM (DAYQUIL PO)   Oral   Take 20 mLs by mouth every 4 (four) hours as needed (cold symptoms).         Marland Kitchen azithromycin (ZITHROMAX) 250 MG tablet   Oral   Take 1 tablet (250 mg total) by mouth daily. Take first 2 tablets together, then 1 every day until finished.   6 tablet   0   . predniSONE (DELTASONE) 20 MG tablet   Oral   Take 2 tablets (40 mg total) by mouth daily.   10 tablet   0    BP 154/102  Pulse 98  Temp(Src) 98.9 F (37.2 C) (Oral)  Resp 18  SpO2 96%  Physical Exam  Nursing note and vitals reviewed. Constitutional: She is oriented to person, place, and time. She appears well-developed and well-nourished. No distress.  HENT:  Head: Normocephalic and atraumatic.  Mouth/Throat: Oropharynx is clear and moist. No oropharyngeal exudate.  Eyes: Conjunctivae  and EOM are normal. Pupils are equal, round, and reactive to light. No scleral icterus.  Neck: Normal range of motion. Neck supple.  Cardiovascular: Normal rate, regular rhythm and normal heart sounds.   Pulmonary/Chest: Effort normal. No stridor. No respiratory distress.  Mild crackles right mid and lower lung fields with mild expiratory wheezes. No accessory muscle use or retractions.  Abdominal: Soft. There is no tenderness.  Musculoskeletal: Normal range of motion.  Neurological: She is alert and oriented to person, place, and time.  Skin: Skin is warm and dry. No rash noted. She is not diaphoretic. No erythema. No pallor.  Psychiatric: She has a normal mood and affect. Her behavior is normal.    ED Course  Procedures (including critical care time)  DIAGNOSTIC STUDIES: Oxygen Saturation  is 96% on RA, normal by my interpretation.    COORDINATION OF CARE: 8:44 PM-Discussed treatment plan which includes an antibiotic, steroid, and albuterol inhaler with pt at bedside and pt agreed to plan.   Labs Review Labs Reviewed - No data to display  Dg Chest 2 View (if Patient Has Fever And/or Copd)  04/22/2013   CLINICAL DATA:  Upper respiratory infection with cough and shortness of breath for 2 days  EXAM: CHEST  2 VIEW  COMPARISON:  07/25/2008  FINDINGS: The heart size and vascular pattern are normal. The left lung is clear. There are increased interstitial markings in the right perihilar region. There are no pleural effusions.  IMPRESSION: Right middle lobe pneumonia.   Electronically Signed   By: Esperanza Heir M.D.   On: 04/22/2013 19:00   EKG Interpretation   None       MDM   1. CAP (community acquired pneumonia)    27 y/o female who presents for cough productive of sputum and R anterior chest pain with deep breathing. CXR findings c/w RML CAP. Patient nontoxic appearing without tachypnea, dyspnea, or hypoxia. She is afebrile in ED today. Patient ambulated with increase in HR to 122bpm; O2 remained above 95%. She was subsequently treated with DuoNeb after which time pulse ox rechecked while ambulating. Patient with improved O2 sats and improvement in HR with 110 bpm with ambulation. Patient states she feels comfortable to manage symptoms at home. No hospitalization in past 90 days and patient otherwise healthy. Will d/c with azithromycin and prednisone burst as well as give albuterol inhaler for SOB symptoms PRN. PCP follow up advised. Return precautions discussed and patient agreeable to plan with no unaddressed concerns.   Filed Vitals:   04/22/13 1815 04/22/13 2052 04/22/13 2223 04/22/13 2241  BP: 154/102   124/90  Pulse: 98 122 110 104  Temp: 98.9 F (37.2 C)   98.8 F (37.1 C)  TempSrc: Oral     Resp: 18     SpO2: 96% 95% 97% 97%   I personally performed the  services described in this documentation, which was scribed in my presence. The recorded information has been reviewed and is accurate.    Antony Madura, PA-C 04/27/13 2120

## 2013-04-22 NOTE — ED Notes (Signed)
Ambulated in the hall pt felt better

## 2013-04-28 NOTE — ED Provider Notes (Signed)
History/physical exam/procedure(s) were performed by non-physician practitioner and as supervising physician I was immediately available for consultation/collaboration. I have reviewed all notes and am in agreement with care and plan.   Hilario Quarry, MD 04/28/13 [phone]

## 2013-06-16 IMAGING — US US OB FOLLOW-UP
1 series · 12 of 28 positions shown · non-contrast
Comparison: none

[Series 1: us ob follow up · 38 acquisitions, 12 frames shown]
[im 2/38]
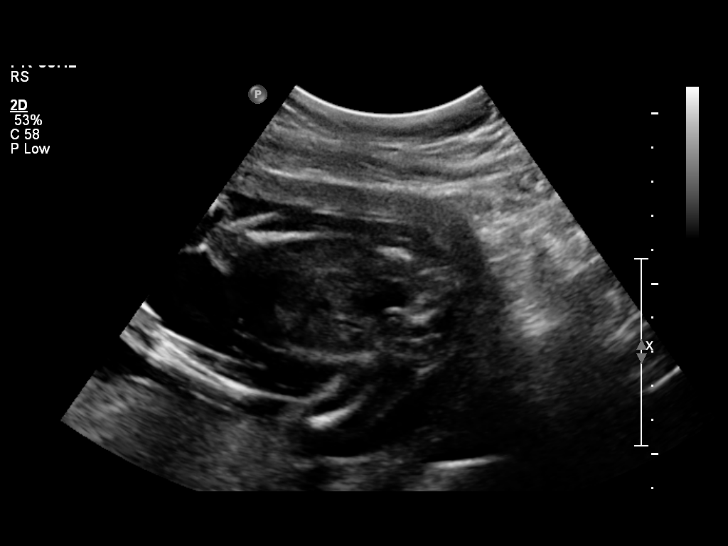
[im 5/38]
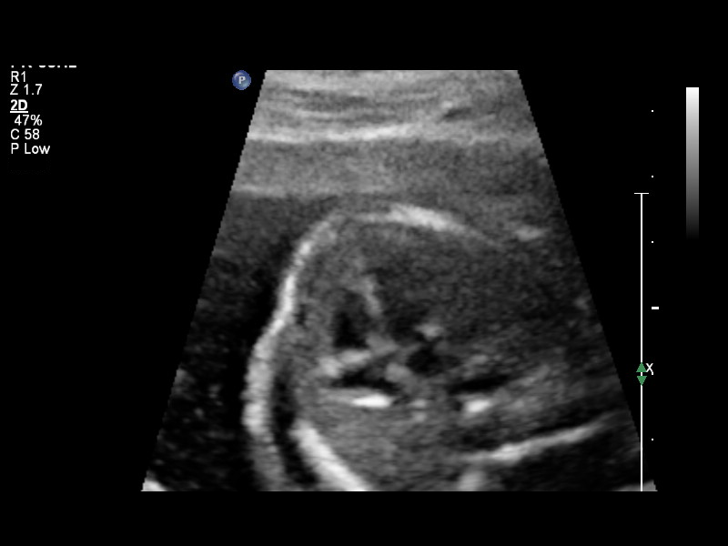
[im 7/38]
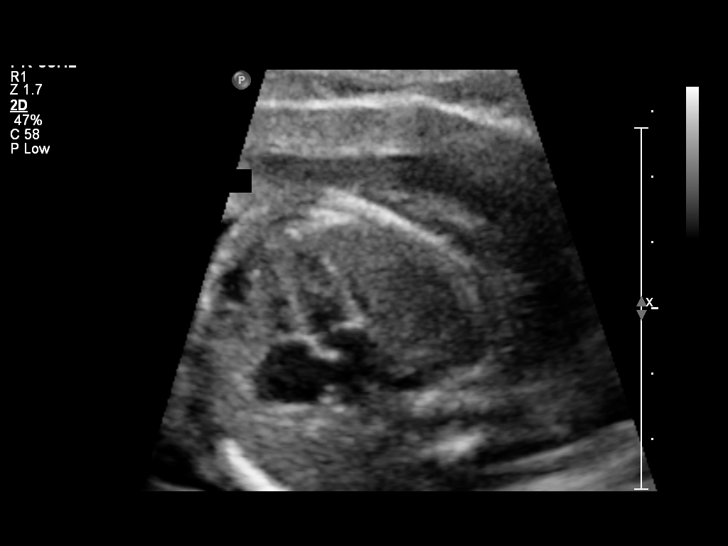
[im 11/38]
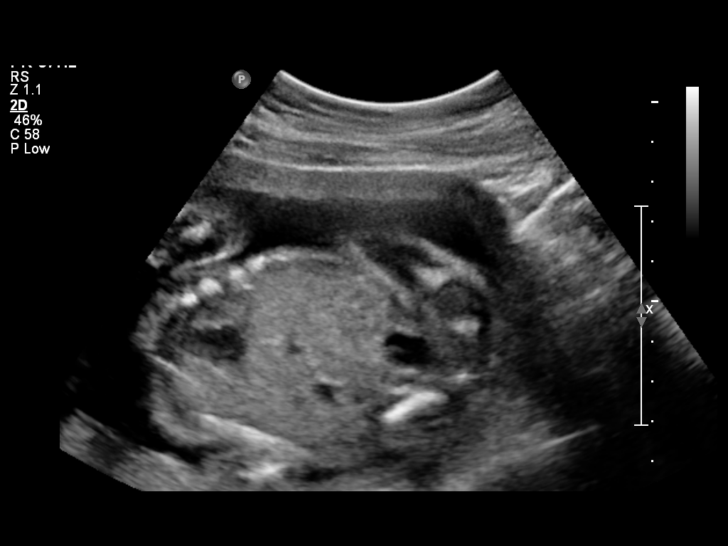
[im 14/38]
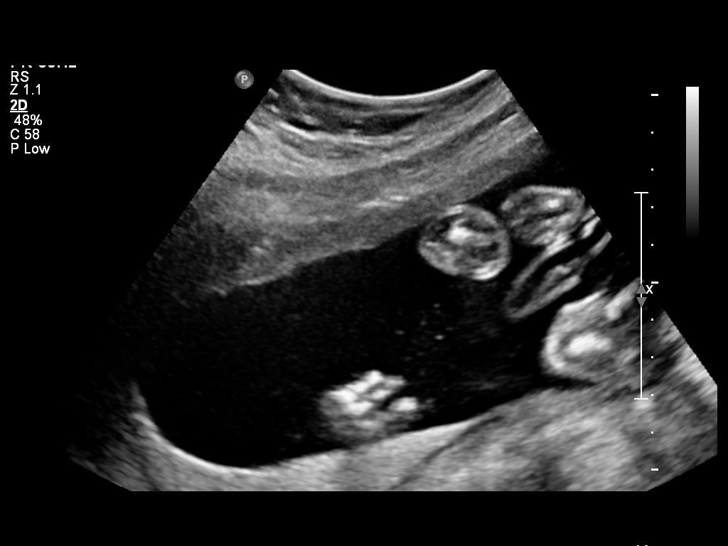
[im 17/38]
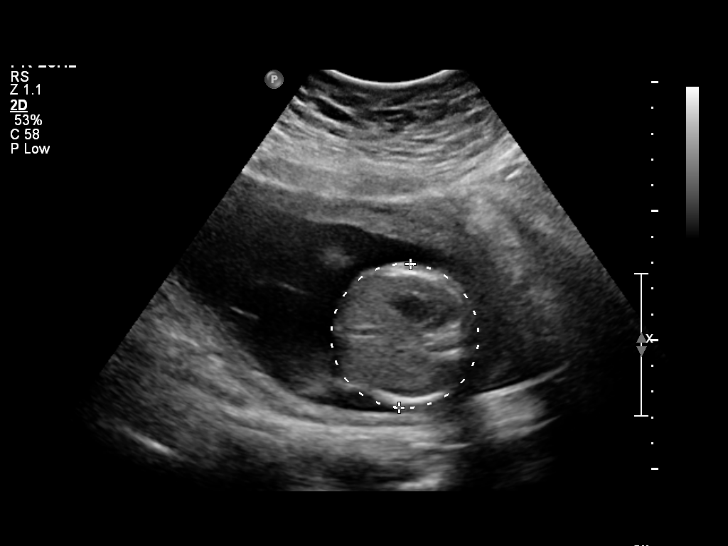
[im 21/38]
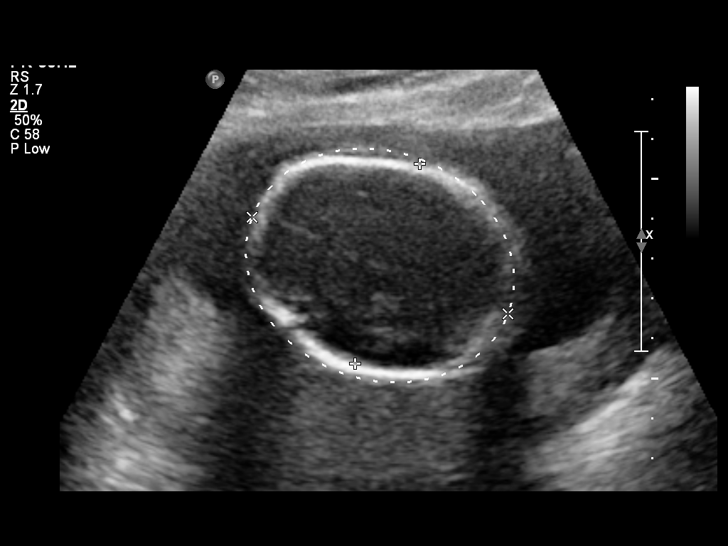
[im 24/38]
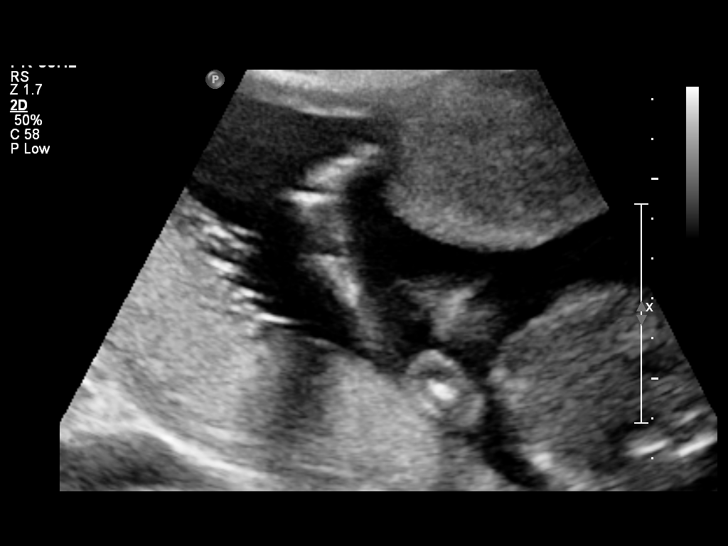
[im 27/38]
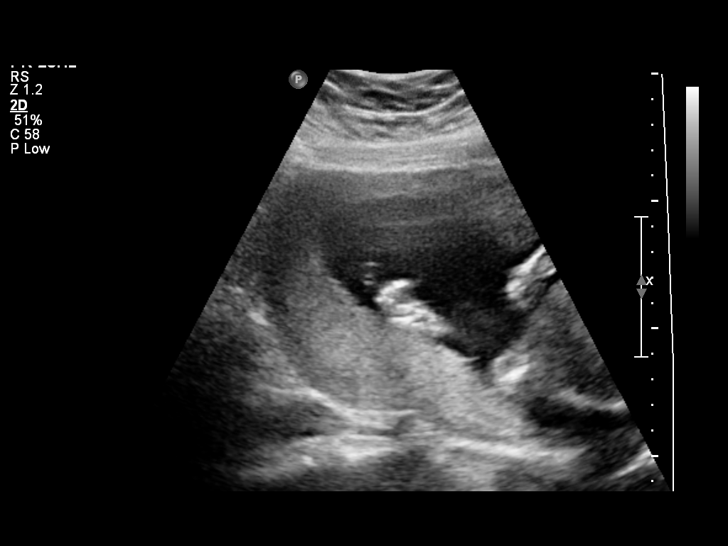
[im 31/38]
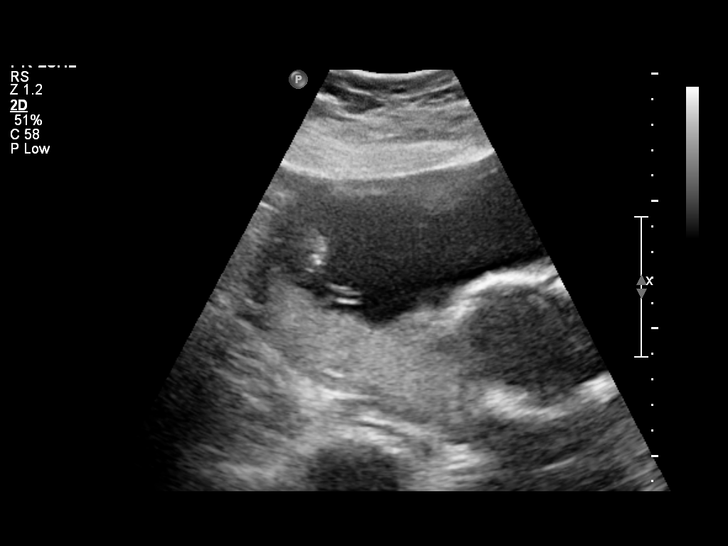
[im 33/38]
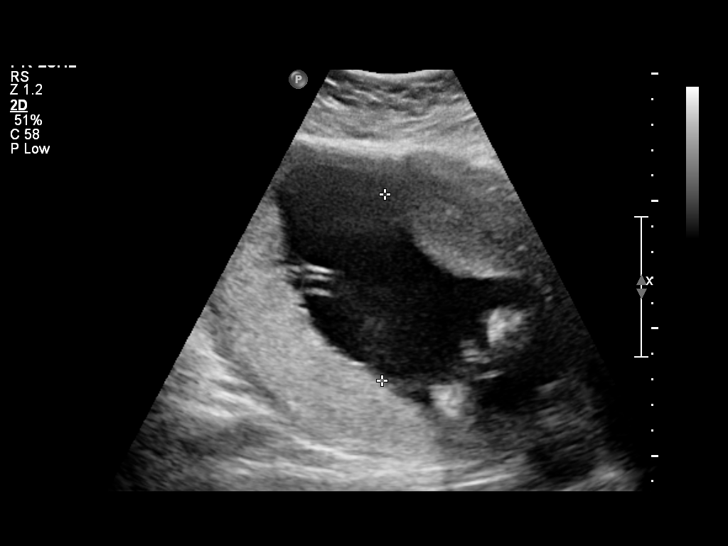
[im 36/38]
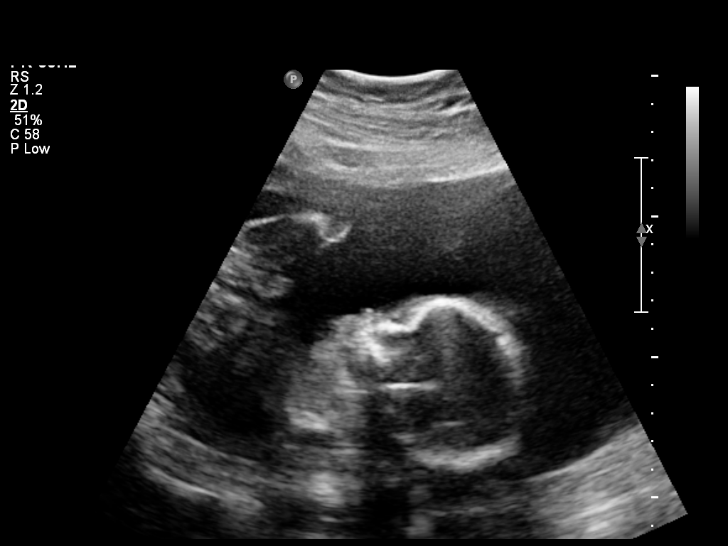

[12 of 28 positions shown; findings below may reference images not displayed]

OBSTETRICS REPORT
                      (Signed Final 09/13/2012 [DATE])

 Name:       RTOYOTA JOSHJAX              Visit Date:  09/13/2012 [DATE]

Service(s) Provided

 US OB FOLLOW UP                                       76816.1
Indications

 Follow-up incomplete fetal anatomic evaluation
 Obesity complicating pregnancy  277 lbs               649.13,
 Previous cervical surgery (Biopsy-neg)
 Poor obstetric history: Previous gestational HTN
 Asthma
Fetal Evaluation

 Num Of Fetuses:    1
 Fetal Heart Rate:  135                         bpm
 Cardiac Activity:  Observed
 Presentation:      Breech
 Placenta:          Posterior, above cervical
                    os
 P. Cord            Previously Visualized
 Insertion:

 Amniotic Fluid
 AFI FV:      Subjectively within normal limits
                                             Larg Pckt:     7.3  cm
Biometry

 BPD:     51.2  mm    G. Age:   21w 4d                CI:        70.44   70 - 86
                                                      FL/HC:      19.6   18.4 -

 HC:     194.5  mm    G. Age:   21w 5d       16  %    HC/AC:      1.10   1.06 -

 AC:     176.6  mm    G. Age:   22w 4d       51  %    FL/BPD:     74.6   71 - 87
 FL:      38.2  mm    G. Age:   22w 2d       38  %    FL/AC:      21.6   20 - 24

 Est. FW:     492  gm      1 lb 1 oz     49  %
Gestational Age

 LMP:           22w 2d       Date:   04/10/12                 EDD:   01/15/13
 U/S Today:     22w 0d                                        EDD:   01/17/13
 Best:          22w 2d    Det. By:   LMP  (04/10/12)          EDD:   01/15/13
Anatomy
 Cranium:          Appears normal         Aortic Arch:      Previously seen
 Fetal Cavum:      Previously seen        Ductal Arch:      Previously seen
 Ventricles:       Previously seen        Diaphragm:        Previously seen
 Choroid Plexus:   Previously seen        Stomach:          Appears normal, left
                                                            sided
 Cerebellum:       Previously seen        Abdomen:          Appears normal
 Posterior Fossa:  Previously seen        Abdominal Wall:   Previously seen
 Nuchal Fold:      Previously seen        Cord Vessels:     Previously seen
 Face:             Orbits appear          Kidneys:          Appear normal
                   normal
 Lips:             Appears normal         Bladder:          Appears normal
 Heart:            Appears normal         Spine:            Previously seen
                   (4CH, axis, and
                   situs)
 RVOT:             Previously seen        Lower             Previously seen
                                          Extremities:
 LVOT:             Previously seen        Upper             Previously seen
                                          Extremities:

 Other:  Fetus appears to be a male. Heels previously visualized. Technically
         difficult due to  maternal habitus.
Cervix Uterus Adnexa

 Cervical Length:   4         cm

 Cervix:       Normal appearance by transabdominal scan.
 Uterus:       No abnormality visualized.
 Cul De Sac:   No free fluid seen.
 Left Ovary:   Not visualized.
 Right Ovary:  Not visualized.
 Adnexa:     No abnormality visualized.
Impression

 Assigned GA is currently 22w 2d.   Appropriate interval fetal
 growth.
 No fetal anomalies seen involving visualized anatomy.  4C
 heart and upper lip were visualized on today's exam.
 Normal amniotic fluid volume. Normal cervical length.

## 2013-06-22 ENCOUNTER — Encounter (HOSPITAL_COMMUNITY): Payer: Self-pay

## 2013-06-22 ENCOUNTER — Inpatient Hospital Stay (HOSPITAL_COMMUNITY): Payer: Medicaid Other

## 2013-06-22 ENCOUNTER — Inpatient Hospital Stay (HOSPITAL_COMMUNITY)
Admission: AD | Admit: 2013-06-22 | Discharge: 2013-06-22 | Disposition: A | Discharge status: Home / Self Care | Payer: Medicaid Other | Source: Ambulatory Visit | Attending: Obstetrics and Gynecology | Admitting: Obstetrics and Gynecology

## 2013-06-22 DIAGNOSIS — Z9851 Tubal ligation status: Secondary | ICD-10-CM | POA: Insufficient documentation

## 2013-06-22 DIAGNOSIS — O99891 Other specified diseases and conditions complicating pregnancy: Secondary | ICD-10-CM | POA: Insufficient documentation

## 2013-06-22 DIAGNOSIS — N949 Unspecified condition associated with female genital organs and menstrual cycle: Secondary | ICD-10-CM | POA: Insufficient documentation

## 2013-06-22 DIAGNOSIS — O9989 Other specified diseases and conditions complicating pregnancy, childbirth and the puerperium: Secondary | ICD-10-CM

## 2013-06-22 DIAGNOSIS — R109 Unspecified abdominal pain: Secondary | ICD-10-CM | POA: Insufficient documentation

## 2013-06-22 LAB — CBC
HCT: 35.9 % — ABNORMAL LOW (ref 36.0–46.0)
Hemoglobin: 12.7 g/dL (ref 12.0–15.0)
MCH: 30.4 pg (ref 26.0–34.0)
MCHC: 35.4 g/dL (ref 30.0–36.0)
MCV: 85.9 fL (ref 78.0–100.0)

## 2013-06-22 LAB — WET PREP, GENITAL: Clue Cells Wet Prep HPF POC: NONE SEEN

## 2013-06-22 LAB — URINE MICROSCOPIC-ADD ON

## 2013-06-22 LAB — URINALYSIS, ROUTINE W REFLEX MICROSCOPIC
Bilirubin Urine: NEGATIVE
Glucose, UA: NEGATIVE mg/dL
Ketones, ur: 15 mg/dL — AB
Leukocytes, UA: NEGATIVE
Protein, ur: NEGATIVE mg/dL

## 2013-06-22 NOTE — MAU Provider Note (Signed)
History     CSN: [id number]  Arrival date and time: 06/22/13 4098   First Provider Initiated Contact with Patient 06/22/13 0310      Chief Complaint  Patient presents with  . Abdominal Pain   Abdominal Pain    [name] is a 27 y.o. [id number] at [redacted]w[redacted]d who presents today with lower abdominal pain. She states that she had a BTL here in July, and her LMP was in October. She states that she has not taken a pregnancy test at home. She started having cramping last night, and it has gotten worse throughout yesterday and tonight. She denies any VB or vaginal discharge.   Past Medical History  Diagnosis Date  . Obesity   . Asthma   . Headache(784.0)   . Pregnancy induced hypertension   . Infection     Urinary tract infection  . Back pain     herniated disk  . Abnormal Pap smear     follow up ok    Past Surgical History  Procedure Laterality Date  . Tonsilectomy, adenoidectomy, bilateral myringotomy and tubes    . Tubal ligation Bilateral 01/16/2013    Procedure: POST PARTUM TUBAL LIGATION;  Surgeon: Adam Phenix, MD;  Location: [facility];  Service: Gynecology;  Laterality: Bilateral;  . Tubal ligation  01/16/2013    Family History  Problem Relation Age of Onset  . Other Neg Hx   . Hypertension Mother   . Diabetes Mother     History  Substance Use Topics  . Smoking status: Former Smoker    Types: Cigarettes    Quit date: 04/22/2012  . Smokeless tobacco: Never Used     Comment: quit with pos preg  . Alcohol Use: No     Comment: rare, once a year.    Allergies:  Allergies  Allergen Reactions  . Iodine Anaphylaxis  . Shellfish Allergy Anaphylaxis    Prescriptions prior to admission  Medication Sig Dispense Refill  . azithromycin (ZITHROMAX) 250 MG tablet Take 1 tablet (250 mg total) by mouth daily. Take first 2 tablets together, then 1 every day until finished.  6 tablet  0  . EPINEPHrine (EPIPEN) 0.3 mg/0.3 mL SOAJ injection Inject 0.3 mg into the muscle  daily as needed (Anaphylaxis).      . predniSONE (DELTASONE) 20 MG tablet Take 2 tablets (40 mg total) by mouth daily.  10 tablet  0  . Pseudoephedrine-APAP-DM (DAYQUIL PO) Take 20 mLs by mouth every 4 (four) hours as needed (cold symptoms).        Review of Systems  Gastrointestinal: Positive for abdominal pain.   Physical Exam   Temperature 98.4 F (36.9 C), temperature source Oral, resp. rate 18, last menstrual period 04/13/2013, not currently breastfeeding.  Physical Exam  Nursing note and vitals reviewed. Constitutional: She is oriented to person, place, and time. She appears well-developed and well-nourished. No distress.  Cardiovascular: Normal rate.   Respiratory: Effort normal.  GI: Soft. There is no tenderness.  Genitourinary:   External: no lesion Vagina: small amount of white discharge Cervix: pink, smooth, no CMT Uterus: slightly enlarged  Adnexa: NT   Neurological: She is alert and oriented to person, place, and time.  Skin: Skin is warm and dry.  Psychiatric: She has a normal mood and affect.    MAU Course  Procedures  Results for orders placed during the hospital encounter of 06/22/13 (from the past 24 hour(s))  POCT PREGNANCY, URINE     Status: Abnormal  Collection Time    06/22/13  2:50 AM      Result Value Range   Preg Test, Ur POSITIVE (*) NEGATIVE  URINALYSIS, ROUTINE W REFLEX MICROSCOPIC     Status: Abnormal   Collection Time    06/22/13  2:51 AM      Result Value Range   Color, Urine YELLOW  YELLOW   APPearance HAZY (*) CLEAR   Specific Gravity, Urine >1.030 (*) 1.005 - 1.030   pH 6.0  5.0 - 8.0   Glucose, UA NEGATIVE  NEGATIVE mg/dL   Hgb urine dipstick TRACE (*) NEGATIVE   Bilirubin Urine NEGATIVE  NEGATIVE   Ketones, ur 15 (*) NEGATIVE mg/dL   Protein, ur NEGATIVE  NEGATIVE mg/dL   Urobilinogen, UA 0.2  0.0 - 1.0 mg/dL   Nitrite NEGATIVE  NEGATIVE   Leukocytes, UA NEGATIVE  NEGATIVE  URINE MICROSCOPIC-ADD ON     Status: Abnormal    Collection Time    06/22/13  2:51 AM      Result Value Range   Squamous Epithelial / LPF MANY (*) RARE   WBC, UA 0-2  <3 WBC/hpf   RBC / HPF 3-6  <3 RBC/hpf   Bacteria, UA FEW (*) RARE  WET PREP, GENITAL     Status: Abnormal   Collection Time    06/22/13  3:20 AM      Result Value Range   Yeast Wet Prep HPF POC NONE SEEN  NONE SEEN   Trich, Wet Prep NONE SEEN  NONE SEEN   Clue Cells Wet Prep HPF POC NONE SEEN  NONE SEEN   WBC, Wet Prep HPF POC FEW (*) NONE SEEN  CBC     Status: Abnormal   Collection Time    06/22/13  3:30 AM      Result Value Range   WBC 8.7  4.0 - 10.5 K/uL   RBC 4.18  3.87 - 5.11 MIL/uL   Hemoglobin 12.7  12.0 - 15.0 g/dL   HCT 34.7 (*) 42.5 - 95.6 %   MCV 85.9  78.0 - 100.0 fL   MCH 30.4  26.0 - 34.0 pg   MCHC 35.4  30.0 - 36.0 g/dL   RDW 38.7  56.4 - 33.2 %   Platelets 194  150 - 400 K/uL  HCG, QUANTITATIVE, PREGNANCY     Status: Abnormal   Collection Time    06/22/13  3:30 AM      Result Value Range   hCG, Beta Chain, Quant, S 95188 (*) <5 mIU/mL   US Ob Comp Less 14 Wks  06/22/2013   CLINICAL DATA:  Pregnancy in the setting of tubal ligation. Check for ectopic pregnancy.  EXAM: OBSTETRIC <14 WK ULTRASOUND  TECHNIQUE: Transabdominal ultrasound was performed for evaluation of the gestation as well as the maternal uterus and adnexal regions.  COMPARISON:  09/13/2012  FINDINGS: Intrauterine gestational sac: Visualized/normal in shape.  Yolk sac:  Present  Embryo:  Present  Cardiac Activity: Present  Heart Rate: 169 bpm  CRL:   31.1  mm   10 w 0 d                  [place] EDC: 01/18/2014  Maternal uterus/adnexae: Symmetric and unremarkable appearing ovaries. Corpus luteum noted on the right. No significant pelvic fluid.  IMPRESSION: Single living intrauterine gestation, estimated age 39 weeks.   Electronically Signed   By: Tiburcio Pea M.D.   On: 06/22/2013 04:21     Assessment and  Plan   1. Pelvic pain complicating pregnancy, antepartum, first trimester    2. Surgical history of tubal ligation    Living IUP on [place] today Start [employer] as soon as possible First trimester precautions reviewed Return to MAU as needed   Tawnya Crook 06/22/2013, 3:18 AM

## 2013-06-22 NOTE — MAU Provider Note (Signed)
`````  Attestation of Attending Supervision of Advanced Practitioner: Evaluation and management procedures were performed by the PA/NP/CNM/OB Fellow under my supervision/collaboration. Chart reviewed and agree with management and plan.  Tilda Burrow 06/22/2013 9:01 AM   `````Attestation of Attending Supervision of Advanced Practitioner: Evaluation and management procedures were performed by the PA/NP/CNM/OB Fellow under my supervision/collaboration. Chart reviewed and agree with management and plan.  Abril Cappiello V 06/22/2013 9:01 AM

## 2013-06-22 NOTE — MAU Note (Signed)
C/o low abd pain.  Reports last period Oct 11th and had a baby 4 months ago and had tubal ligation.

## 2013-06-24 LAB — GC/CHLAMYDIA PROBE AMP: CT Probe RNA: NEGATIVE

## 2013-07-04 NOTE — L&D Delivery Note (Signed)
Delivery Note At 3:40 PM a viable female was delivered via Vaginal, Spontaneous Delivery (Presentation: Right Occiput Anterior).  APGAR: 9, 10; weight TBD.   Placenta status: Intact, Spontaneous.  Cord: 3 vessels with the following complications: None.    Anesthesia: Epidural  Episiotomy: None Lacerations: None Suture Repair: na Est. Blood Loss (mL): 300  Mom to postpartum.  Baby to Couplet care / Skin to Skin.  Pt pushed with good maternal effort to deliver a liveborn female via NSVD with spontaneous cry.   Baby placed on maternal abdomen.  Delayed cord clamping performed.  Cord cut by FOB.  Placenta delivered intact with 3V cord via traction and pitocin.  no tears. No complications.  Mom and baby to postpartum.   Travus Oren L 01/02/2014, 4:49 PM

## 2013-07-15 LAB — OB RESULTS CONSOLE GC/CHLAMYDIA
CHLAMYDIA, DNA PROBE: NEGATIVE
GC PROBE AMP, GENITAL: NEGATIVE

## 2013-07-15 LAB — OB RESULTS CONSOLE HIV ANTIBODY (ROUTINE TESTING): HIV: NONREACTIVE

## 2013-07-15 LAB — OB RESULTS CONSOLE RPR: RPR: NONREACTIVE

## 2013-07-15 LAB — OB RESULTS CONSOLE HEPATITIS B SURFACE ANTIGEN: Hepatitis B Surface Ag: NEGATIVE

## 2013-08-13 ENCOUNTER — Other Ambulatory Visit (HOSPITAL_COMMUNITY): Payer: Self-pay | Admitting: Physician Assistant

## 2013-08-13 ENCOUNTER — Other Ambulatory Visit: Payer: Self-pay

## 2013-08-13 DIAGNOSIS — Z3689 Encounter for other specified antenatal screening: Secondary | ICD-10-CM

## 2013-08-20 ENCOUNTER — Other Ambulatory Visit (HOSPITAL_COMMUNITY): Payer: Self-pay | Admitting: Physician Assistant

## 2013-08-20 ENCOUNTER — Ambulatory Visit (HOSPITAL_COMMUNITY)
Admission: RE | Admit: 2013-08-20 | Discharge: 2013-08-20 | Disposition: A | Discharge status: Home / Self Care | Payer: Medicaid Other | Source: Ambulatory Visit | Attending: Physician Assistant | Admitting: Physician Assistant

## 2013-08-20 DIAGNOSIS — Z3689 Encounter for other specified antenatal screening: Secondary | ICD-10-CM

## 2013-08-20 DIAGNOSIS — Z363 Encounter for antenatal screening for malformations: Secondary | ICD-10-CM | POA: Insufficient documentation

## 2013-08-20 DIAGNOSIS — Z1389 Encounter for screening for other disorder: Secondary | ICD-10-CM | POA: Insufficient documentation

## 2013-08-20 DIAGNOSIS — O99210 Obesity complicating pregnancy, unspecified trimester: Secondary | ICD-10-CM

## 2013-08-20 DIAGNOSIS — E669 Obesity, unspecified: Secondary | ICD-10-CM | POA: Insufficient documentation

## 2013-08-20 DIAGNOSIS — O358XX0 Maternal care for other (suspected) fetal abnormality and damage, not applicable or unspecified: Secondary | ICD-10-CM | POA: Insufficient documentation

## 2013-08-22 ENCOUNTER — Other Ambulatory Visit (HOSPITAL_COMMUNITY): Payer: Self-pay | Admitting: Physician Assistant

## 2013-08-22 DIAGNOSIS — Z1389 Encounter for screening for other disorder: Secondary | ICD-10-CM

## 2013-09-13 ENCOUNTER — Inpatient Hospital Stay (HOSPITAL_COMMUNITY)
Admission: AD | Admit: 2013-09-13 | Discharge: 2013-09-13 | Disposition: A | Discharge status: Home / Self Care | Payer: Medicaid Other | Source: Ambulatory Visit | Attending: Family Medicine | Admitting: Family Medicine

## 2013-09-13 ENCOUNTER — Encounter (HOSPITAL_COMMUNITY): Payer: Self-pay

## 2013-09-13 DIAGNOSIS — Z87891 Personal history of nicotine dependence: Secondary | ICD-10-CM | POA: Insufficient documentation

## 2013-09-13 DIAGNOSIS — R05 Cough: Secondary | ICD-10-CM | POA: Insufficient documentation

## 2013-09-13 DIAGNOSIS — O9989 Other specified diseases and conditions complicating pregnancy, childbirth and the puerperium: Principal | ICD-10-CM

## 2013-09-13 DIAGNOSIS — J3489 Other specified disorders of nose and nasal sinuses: Secondary | ICD-10-CM | POA: Insufficient documentation

## 2013-09-13 DIAGNOSIS — R059 Cough, unspecified: Secondary | ICD-10-CM | POA: Insufficient documentation

## 2013-09-13 DIAGNOSIS — J069 Acute upper respiratory infection, unspecified: Secondary | ICD-10-CM | POA: Insufficient documentation

## 2013-09-13 DIAGNOSIS — J029 Acute pharyngitis, unspecified: Secondary | ICD-10-CM | POA: Insufficient documentation

## 2013-09-13 DIAGNOSIS — O99891 Other specified diseases and conditions complicating pregnancy: Secondary | ICD-10-CM | POA: Insufficient documentation

## 2013-09-13 MED ORDER — AMOXICILLIN 500 MG PO CAPS: 500.0000 mg | ORAL_CAPSULE | Freq: Three times a day (TID) | ORAL | Status: DC

## 2013-09-13 MED ORDER — ACETAMINOPHEN-CODEINE #3 300-30 MG PO TABS: 1.0000 | ORAL_TABLET | Freq: Four times a day (QID) | ORAL | Status: DC | PRN

## 2013-09-13 NOTE — MAU Note (Signed)
Pt states began w sore throat Wednesday, has lost her voice. Non-productive cough as well that began yesterday. Denies pregnancy issues, no bleeding or vag d/c changes.

## 2013-09-13 NOTE — Progress Notes (Signed)
Dr. Margo AyeHall notified pt in MAU w c/o sore throat, will come see pt.

## 2013-09-13 NOTE — MAU Provider Note (Signed)
Attestation of Attending Supervision of Advanced Practitioner (PA/CNM/NP): Evaluation and management procedures were performed by the Advanced Practitioner under my supervision and collaboration.  I have reviewed the Advanced Practitioner's note and chart, and I agree with the management and plan.  Telma Pyeatt S, MD [facility] Attending 09/13/2013 2:22 PM   

## 2013-09-13 NOTE — Discharge Instructions (Signed)

## 2013-09-13 NOTE — MAU Note (Signed)
Sore throat, laryngitis, non-productive cough.

## 2013-09-13 NOTE — Progress Notes (Signed)
Dr. Margo AyeHall notified pt in MAU

## 2013-09-13 NOTE — MAU Provider Note (Signed)
History     CSN: [id number]  Arrival date and time: 09/13/13 1054   First Provider Initiated Contact with Patient 09/13/13 1205      Chief Complaint  Patient presents with  . Sore Throat   HPI  Pt is a 28 yo G5P4004 [redacted] week pregnant female with a history of pneumonia and tonsillectomy presenting with 2 days of sore throat, a yellow-mucusy cough, and nasal congestion. These symptoms are consistent and have progressively worsened over this time. Pt has tried Cepacol spray and throat lozenges with no relief. She attempted to take tylenol today but coughed it up when it got stuck in her throat.  No fever, nausea, shortness of breath, headache, diarrhea, or myalgias. She has not had a flu shot this year. Pt states that her son was recently diagnoses with pneumonia and was given azithromycin as treatment. She has had a history of pneumonia in the past and feels that the sx are not consistent with this. Negative smoking contacts. Pt denies any vaginal bleeding or fluid leakage. The baby has been moving normally and she has had no contractions.  Past Medical History  Diagnosis Date  . Obesity   . Asthma   . Headache(784.0)   . Pregnancy induced hypertension   . Infection     Urinary tract infection  . Back pain     herniated disk  . Abnormal Pap smear     follow up ok    Past Surgical History  Procedure Laterality Date  . Tonsilectomy, adenoidectomy, bilateral myringotomy and tubes    . Tubal ligation Bilateral 01/16/2013    Procedure: POST PARTUM TUBAL LIGATION;  Surgeon: Adam Phenix, MD;  Location: [facility];  Service: Gynecology;  Laterality: Bilateral;  . Tubal ligation  01/16/2013  . Tonsillectomy    . Wisdom tooth extraction      Family History  Problem Relation Age of Onset  . Other Neg Hx   . Hypertension Mother   . Diabetes Mother     History  Substance Use Topics  . Smoking status: Former Smoker    Types: Cigarettes    Quit date: 04/22/2012  . Smokeless tobacco:  Never Used     Comment: quit with pos preg  . Alcohol Use: No     Comment: rare, once a year.    Allergies:  Allergies  Allergen Reactions  . Iodine Anaphylaxis  . Shellfish Allergy Anaphylaxis    Prescriptions prior to admission  Medication Sig Dispense Refill  . Prenatal Vit-Fe Fumarate-FA (PRENATAL MULTIVITAMIN) TABS tablet Take 1 tablet by mouth daily at 12 noon.      Marland Kitchen EPINEPHrine (EPIPEN) 0.3 mg/0.3 mL SOAJ injection Inject 0.3 mg into the muscle daily as needed (Anaphylaxis).        Review of Systems  Constitutional: Negative for fever, chills and malaise/fatigue.  HENT: Positive for congestion and sore throat. Negative for ear pain.   Eyes: Negative for pain.  Respiratory: Positive for cough and sputum production (yellow). Negative for shortness of breath.   Cardiovascular: Negative for chest pain, palpitations and leg swelling.  Gastrointestinal: Negative for nausea, vomiting, abdominal pain and diarrhea.  Musculoskeletal: Negative for myalgias.  Skin: Negative for rash.  Neurological: Negative for dizziness, weakness and headaches.   Physical Exam   Blood pressure 117/55, pulse 108, temperature 98 F (36.7 C), temperature source Oral, resp. rate 18, last menstrual period 04/13/2013, SpO2 100.00%, not currently breastfeeding.  Physical Exam  Constitutional: She appears well-developed. No distress.  HENT:  Nose: No rhinorrhea.  Mouth/Throat: No oropharyngeal exudate, posterior oropharyngeal edema or posterior oropharyngeal erythema.  Pharynx is injected  Cardiovascular: Normal rate, regular rhythm and normal heart sounds.   Respiratory: Effort normal and breath sounds normal. No stridor. No respiratory distress. She has no wheezes. She has no rales.  GI: Soft. She exhibits no distension.  Lymphadenopathy:    She has no cervical adenopathy.  Neurological: She is alert.    MAU Course  Procedures  MDM   Assessment and Plan   A: URI  P: Amoxicillin 500  mg TID x 7 days Tylenol # 3 prn  Fluids and rest  Unknown FoleyWinkel, Bradley T 09/13/2013, 12:17 PM

## 2013-09-24 ENCOUNTER — Ambulatory Visit (HOSPITAL_COMMUNITY)
Admission: RE | Admit: 2013-09-24 | Discharge: 2013-09-24 | Disposition: A | Discharge status: Home / Self Care | Payer: Medicaid Other | Source: Ambulatory Visit | Attending: Physician Assistant | Admitting: Physician Assistant

## 2013-09-24 ENCOUNTER — Ambulatory Visit (HOSPITAL_COMMUNITY): Payer: Medicaid Other

## 2013-09-24 DIAGNOSIS — E669 Obesity, unspecified: Secondary | ICD-10-CM | POA: Insufficient documentation

## 2013-09-24 DIAGNOSIS — Z1389 Encounter for screening for other disorder: Secondary | ICD-10-CM

## 2013-09-24 DIAGNOSIS — Z3689 Encounter for other specified antenatal screening: Secondary | ICD-10-CM | POA: Insufficient documentation

## 2013-09-24 DIAGNOSIS — O99210 Obesity complicating pregnancy, unspecified trimester: Principal | ICD-10-CM

## 2013-10-29 ENCOUNTER — Other Ambulatory Visit (HOSPITAL_COMMUNITY): Payer: Self-pay | Admitting: Physician Assistant

## 2013-11-01 ENCOUNTER — Ambulatory Visit (HOSPITAL_COMMUNITY)
Admission: RE | Admit: 2013-11-01 | Discharge: 2013-11-01 | Disposition: A | Discharge status: Home / Self Care | Payer: Medicaid Other | Source: Ambulatory Visit | Attending: Physician Assistant | Admitting: Physician Assistant

## 2013-11-01 DIAGNOSIS — Z3689 Encounter for other specified antenatal screening: Secondary | ICD-10-CM | POA: Insufficient documentation

## 2013-11-01 DIAGNOSIS — O3660X0 Maternal care for excessive fetal growth, unspecified trimester, not applicable or unspecified: Secondary | ICD-10-CM | POA: Insufficient documentation

## 2013-11-05 ENCOUNTER — Other Ambulatory Visit (HOSPITAL_COMMUNITY): Payer: Self-pay | Admitting: Physician Assistant

## 2013-11-05 DIAGNOSIS — O3660X0 Maternal care for excessive fetal growth, unspecified trimester, not applicable or unspecified: Secondary | ICD-10-CM

## 2013-11-05 DIAGNOSIS — O283 Abnormal ultrasonic finding on antenatal screening of mother: Secondary | ICD-10-CM

## 2013-11-15 ENCOUNTER — Ambulatory Visit (HOSPITAL_COMMUNITY)
Admission: RE | Admit: 2013-11-15 | Discharge: 2013-11-15 | Disposition: A | Discharge status: Home / Self Care | Payer: Medicaid Other | Source: Ambulatory Visit | Attending: Physician Assistant | Admitting: Physician Assistant

## 2013-11-15 ENCOUNTER — Ambulatory Visit (HOSPITAL_COMMUNITY): Admission: RE | Admit: 2013-11-15 | Payer: Medicaid Other | Source: Ambulatory Visit

## 2013-11-15 ENCOUNTER — Other Ambulatory Visit (HOSPITAL_COMMUNITY): Payer: Self-pay | Admitting: Physician Assistant

## 2013-11-15 DIAGNOSIS — O283 Abnormal ultrasonic finding on antenatal screening of mother: Secondary | ICD-10-CM

## 2013-11-15 DIAGNOSIS — O289 Unspecified abnormal findings on antenatal screening of mother: Secondary | ICD-10-CM | POA: Insufficient documentation

## 2013-11-15 DIAGNOSIS — O3660X0 Maternal care for excessive fetal growth, unspecified trimester, not applicable or unspecified: Secondary | ICD-10-CM | POA: Insufficient documentation

## 2013-11-15 DIAGNOSIS — Z3689 Encounter for other specified antenatal screening: Secondary | ICD-10-CM | POA: Insufficient documentation

## 2013-12-04 ENCOUNTER — Other Ambulatory Visit (HOSPITAL_COMMUNITY): Payer: Self-pay | Admitting: Physician Assistant

## 2013-12-04 DIAGNOSIS — O358XX0 Maternal care for other (suspected) fetal abnormality and damage, not applicable or unspecified: Secondary | ICD-10-CM

## 2013-12-04 DIAGNOSIS — E669 Obesity, unspecified: Secondary | ICD-10-CM

## 2013-12-04 DIAGNOSIS — O99210 Obesity complicating pregnancy, unspecified trimester: Secondary | ICD-10-CM

## 2013-12-05 ENCOUNTER — Encounter: Payer: Self-pay | Admitting: *Deleted

## 2013-12-13 ENCOUNTER — Ambulatory Visit (HOSPITAL_COMMUNITY)
Admission: RE | Admit: 2013-12-13 | Discharge: 2013-12-13 | Disposition: A | Discharge status: Home / Self Care | Payer: Medicaid Other | Source: Ambulatory Visit | Attending: Physician Assistant | Admitting: Physician Assistant

## 2013-12-13 ENCOUNTER — Encounter (HOSPITAL_COMMUNITY): Payer: Self-pay

## 2013-12-13 DIAGNOSIS — O99210 Obesity complicating pregnancy, unspecified trimester: Secondary | ICD-10-CM

## 2013-12-13 DIAGNOSIS — E669 Obesity, unspecified: Secondary | ICD-10-CM | POA: Insufficient documentation

## 2013-12-13 DIAGNOSIS — O358XX0 Maternal care for other (suspected) fetal abnormality and damage, not applicable or unspecified: Secondary | ICD-10-CM | POA: Insufficient documentation

## 2013-12-13 NOTE — Progress Notes (Signed)
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  Findings: 1. Single intrauterine pregnancy. 2. Estimated fetal weight is in the 88th%. 3. Posterior placenta without evidence of previa. 4. Normal amniotic fluid index. 5. The limited anatomy survey is normal. The right kidney is longer than the left kidney but could not visualize double collecting system on today's exam; however views are limited. The renal pelves are normal.  Recommendations: 1. Appropriate fetal growth although slightly accelerated. 2. 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 Recommend follow up ultrasounds as clinically indicated.  Patricia FosterKristen Marjean Imperato, MD

## 2013-12-23 LAB — OB RESULTS CONSOLE GBS: STREP GROUP B AG: NEGATIVE

## 2014-01-01 ENCOUNTER — Inpatient Hospital Stay (HOSPITAL_COMMUNITY)
Admission: AD | Admit: 2014-01-01 | Discharge: 2014-01-04 | DRG: 775 | Disposition: A | Discharge status: Home / Self Care | Payer: Medicaid Other | Source: Ambulatory Visit | Attending: Obstetrics & Gynecology | Admitting: Obstetrics & Gynecology

## 2014-01-01 ENCOUNTER — Encounter (HOSPITAL_COMMUNITY): Payer: Self-pay | Admitting: *Deleted

## 2014-01-01 ENCOUNTER — Other Ambulatory Visit (HOSPITAL_COMMUNITY): Payer: Self-pay | Admitting: Nurse Practitioner

## 2014-01-01 DIAGNOSIS — Z6841 Body Mass Index (BMI) 40.0 and over, adult: Secondary | ICD-10-CM

## 2014-01-01 DIAGNOSIS — Z87891 Personal history of nicotine dependence: Secondary | ICD-10-CM

## 2014-01-01 DIAGNOSIS — IMO0001 Reserved for inherently not codable concepts without codable children: Secondary | ICD-10-CM

## 2014-01-01 DIAGNOSIS — Z8249 Family history of ischemic heart disease and other diseases of the circulatory system: Secondary | ICD-10-CM | POA: Diagnosis not present

## 2014-01-01 DIAGNOSIS — O139 Gestational [pregnancy-induced] hypertension without significant proteinuria, unspecified trimester: Principal | ICD-10-CM | POA: Diagnosis present

## 2014-01-01 DIAGNOSIS — O133 Gestational [pregnancy-induced] hypertension without significant proteinuria, third trimester: Secondary | ICD-10-CM

## 2014-01-01 DIAGNOSIS — O99214 Obesity complicating childbirth: Secondary | ICD-10-CM

## 2014-01-01 DIAGNOSIS — Z833 Family history of diabetes mellitus: Secondary | ICD-10-CM

## 2014-01-01 DIAGNOSIS — E669 Obesity, unspecified: Secondary | ICD-10-CM | POA: Diagnosis present

## 2014-01-01 DIAGNOSIS — Z3490 Encounter for supervision of normal pregnancy, unspecified, unspecified trimester: Secondary | ICD-10-CM

## 2014-01-01 DIAGNOSIS — Z9851 Tubal ligation status: Secondary | ICD-10-CM | POA: Diagnosis not present

## 2014-01-01 DIAGNOSIS — E348 Other specified endocrine disorders: Secondary | ICD-10-CM

## 2014-01-01 DIAGNOSIS — R030 Elevated blood-pressure reading, without diagnosis of hypertension: Secondary | ICD-10-CM | POA: Diagnosis present

## 2014-01-01 LAB — COMPREHENSIVE METABOLIC PANEL
ALK PHOS: 107 U/L (ref 39–117)
ALT: 33 U/L (ref 0–35)
ANION GAP: 13 (ref 5–15)
AST: 80 U/L — ABNORMAL HIGH (ref 0–37)
Albumin: 2.8 g/dL — ABNORMAL LOW (ref 3.5–5.2)
BILIRUBIN TOTAL: 0.5 mg/dL (ref 0.3–1.2)
BUN: 3 mg/dL — AB (ref 6–23)
CHLORIDE: 101 meq/L (ref 96–112)
CO2: 24 mEq/L (ref 19–32)
Calcium: 9.2 mg/dL (ref 8.4–10.5)
Creatinine, Ser: 0.57 mg/dL (ref 0.50–1.10)
GFR calc Af Amer: >90 mL/min (ref >90)
GFR calc non Af Amer: >90 mL/min (ref >90)
Glucose, Bld: 87 mg/dL (ref 70–99)
POTASSIUM: 3 meq/L — AB (ref 3.7–5.3)
SODIUM: 138 meq/L (ref 137–147)
TOTAL PROTEIN: 6 g/dL (ref 6.0–8.3)

## 2014-01-01 LAB — CBC
HEMATOCRIT: 31.8 % — AB (ref 36.0–46.0)
HEMATOCRIT: 32.3 % — AB (ref 36.0–46.0)
HEMOGLOBIN: 11 g/dL — AB (ref 12.0–15.0)
Hemoglobin: 11.3 g/dL — ABNORMAL LOW (ref 12.0–15.0)
MCH: 30.3 pg (ref 26.0–34.0)
MCH: 30.7 pg (ref 26.0–34.0)
MCHC: 34.6 g/dL (ref 30.0–36.0)
MCHC: 35 g/dL (ref 30.0–36.0)
MCV: 87.6 fL (ref 78.0–100.0)
MCV: 87.8 fL (ref 78.0–100.0)
Platelets: 134 10*3/uL — ABNORMAL LOW (ref 150–400)
Platelets: 161 10*3/uL (ref 150–400)
RBC: 3.63 MIL/uL — ABNORMAL LOW (ref 3.87–5.11)
RBC: 3.68 MIL/uL — ABNORMAL LOW (ref 3.87–5.11)
RDW: 13 % (ref 11.5–15.5)
RDW: 13.1 % (ref 11.5–15.5)
WBC: 6.3 10*3/uL (ref 4.0–10.5)
WBC: 6.3 10*3/uL (ref 4.0–10.5)

## 2014-01-01 LAB — PROTEIN / CREATININE RATIO, URINE
Creatinine, Urine: 64.44 mg/dL
PROTEIN CREATININE RATIO: 0.19 — AB (ref 0.00–0.15)
Total Protein, Urine: 12.3 mg/dL

## 2014-01-01 LAB — TYPE AND SCREEN
ABO/RH(D): AB POS
Antibody Screen: NEGATIVE

## 2014-01-01 MED ORDER — OXYTOCIN 40 UNITS IN LACTATED RINGERS INFUSION - SIMPLE MED
62.5000 mL/h | INTRAVENOUS | Status: DC
  Administered 2014-01-02: 62.5 mL/h via INTRAVENOUS

## 2014-01-01 MED ORDER — LIDOCAINE HCL (PF) 1 % IJ SOLN
30.0000 mL | INTRAMUSCULAR | Status: DC | PRN
  Filled 2014-01-01: qty 30

## 2014-01-01 MED ORDER — TERBUTALINE SULFATE 1 MG/ML IJ SOLN: 0.2500 mg | Freq: Once | INTRAMUSCULAR | Status: AC | PRN

## 2014-01-01 MED ORDER — PHENYLEPHRINE 40 MCG/ML (10ML) SYRINGE FOR IV PUSH (FOR BLOOD PRESSURE SUPPORT)
80.0000 ug | PREFILLED_SYRINGE | INTRAVENOUS | Status: DC | PRN
  Filled 2014-01-01: qty 2

## 2014-01-01 MED ORDER — ACETAMINOPHEN 325 MG PO TABS: 650.0000 mg | ORAL_TABLET | ORAL | Status: DC | PRN

## 2014-01-01 MED ORDER — CITRIC ACID-SODIUM CITRATE 334-500 MG/5ML PO SOLN: 30.0000 mL | ORAL | Status: DC | PRN

## 2014-01-01 MED ORDER — LACTATED RINGERS IV SOLN
INTRAVENOUS | Status: DC
Start: 2014-01-01 — End: 2014-01-02

## 2014-01-01 MED ORDER — EPHEDRINE 5 MG/ML INJ
10.0000 mg | INTRAVENOUS | Status: DC | PRN
  Filled 2014-01-01: qty 2

## 2014-01-01 MED ORDER — ONDANSETRON HCL 4 MG/2ML IJ SOLN: 4.0000 mg | Freq: Four times a day (QID) | INTRAMUSCULAR | Status: DC | PRN

## 2014-01-01 MED ORDER — OXYCODONE-ACETAMINOPHEN 5-325 MG PO TABS: 1.0000 | ORAL_TABLET | ORAL | Status: DC | PRN

## 2014-01-01 MED ORDER — FENTANYL 2.5 MCG/ML BUPIVACAINE 1/10 % EPIDURAL INFUSION (WH - ANES)
14.0000 mL/h | INTRAMUSCULAR | Status: DC | PRN
  Administered 2014-01-02 (×2): 14 mL/h via EPIDURAL
  Filled 2014-01-01 (×2): qty 125

## 2014-01-01 MED ORDER — IBUPROFEN 600 MG PO TABS
600.0000 mg | ORAL_TABLET | Freq: Four times a day (QID) | ORAL | Status: DC | PRN
  Administered 2014-01-02: 600 mg via ORAL
  Filled 2014-01-01: qty 1

## 2014-01-01 MED ORDER — DIPHENHYDRAMINE HCL 50 MG/ML IJ SOLN: 12.5000 mg | INTRAMUSCULAR | Status: DC | PRN

## 2014-01-01 MED ORDER — POTASSIUM CHLORIDE CRYS ER 20 MEQ PO TBCR
30.0000 meq | EXTENDED_RELEASE_TABLET | ORAL | Status: AC
  Administered 2014-01-01 (×2): 30 meq via ORAL
  Filled 2014-01-01 (×2): qty 1

## 2014-01-01 MED ORDER — OXYTOCIN BOLUS FROM INFUSION: 500.0000 mL | INTRAVENOUS | Status: DC

## 2014-01-01 MED ORDER — MISOPROSTOL 25 MCG QUARTER TABLET
25.0000 ug | ORAL_TABLET | ORAL | Status: DC | PRN
  Administered 2014-01-01: 25 ug via VAGINAL
  Filled 2014-01-01: qty 1
  Filled 2014-01-01: qty 0.25

## 2014-01-01 MED ORDER — EPHEDRINE 5 MG/ML INJ
10.0000 mg | INTRAVENOUS | Status: DC | PRN
  Filled 2014-01-01: qty 4
  Filled 2014-01-01: qty 2

## 2014-01-01 MED ORDER — PHENYLEPHRINE 40 MCG/ML (10ML) SYRINGE FOR IV PUSH (FOR BLOOD PRESSURE SUPPORT)
80.0000 ug | PREFILLED_SYRINGE | INTRAVENOUS | Status: DC | PRN
  Filled 2014-01-01: qty 2
  Filled 2014-01-01: qty 10

## 2014-01-01 MED ORDER — LACTATED RINGERS IV SOLN
500.0000 mL | INTRAVENOUS | Status: DC | PRN
  Administered 2014-01-02: 500 mL via INTRAVENOUS

## 2014-01-01 MED ORDER — LACTATED RINGERS IV SOLN
500.0000 mL | Freq: Once | INTRAVENOUS | Status: AC
  Administered 2014-01-02: 500 mL via INTRAVENOUS

## 2014-01-01 NOTE — MAU Note (Signed)
Patient states she was seen in the clinic and sent to MAU for IOL for elevated blood pressure.

## 2014-01-01 NOTE — H&P (Signed)
[name] is a 28 y.o. female [id number] with IUP at 380w4d presenting for Induction of Labor due to elevated blood pressures. Pt states she has been having regular, every 4 hours contractions, associated with none vaginal bleeding.  Membranes are intact, with active fetal movement.    Mild Right sided headache - comes and goes, No Vision changes, no RUQ pain  [facility]   Prenatal History/Complications: GHTN  Past Medical History: Past Medical History  Diagnosis Date  . Obesity   . Asthma   . Headache(784.0)   . Pregnancy induced hypertension   . Infection     Urinary tract infection  . Back pain     herniated disk  . Abnormal Pap smear     follow up ok    Past Surgical History: Past Surgical History  Procedure Laterality Date  . Tonsilectomy, adenoidectomy, bilateral myringotomy and tubes    . Tubal ligation Bilateral 01/16/2013    Procedure: POST PARTUM TUBAL LIGATION;  Surgeon: Adam PhenixJames G Arnold, MD;  Location: [facility];  Service: Gynecology;  Laterality: Bilateral;  . Tubal ligation  01/16/2013  . Tonsillectomy    . Wisdom tooth extraction      Obstetrical History: OB History   Grav Para Term Preterm Abortions TAB SAB Ect Mult Living   5 4 4       4       Gynecological History: OB History   Grav Para Term Preterm Abortions TAB SAB Ect Mult Living   5 4 4       4       Social History: History   Social History  . Marital Status: Married    Spouse Name: N/A    Number of Children: N/A  . Years of Education: N/A   Social History Main Topics  . Smoking status: Former Smoker    Types: Cigarettes    Quit date: 04/22/2012  . Smokeless tobacco: Never Used     Comment: quit with pos preg  . Alcohol Use: No     Comment: rare, once a year.  . Drug Use: No  . Sexual Activity: Yes    Birth Control/ Protection: None   Other Topics Concern  . None   Social History Narrative  . None    Family History: Family History  Problem  Relation Age of Onset  . Other Neg Hx   . Hypertension Mother   . Diabetes Mother     Allergies: Allergies  Allergen Reactions  . Iodine Anaphylaxis  . Shellfish Allergy Anaphylaxis    Prescriptions prior to admission  Medication Sig Dispense Refill  . acetaminophen (TYLENOL) 500 MG tablet Take 1,000 mg by mouth every 6 (six) hours as needed for headache.      . Prenatal Vit-Fe Fumarate-FA (PRENATAL MULTIVITAMIN) TABS tablet Take 1 tablet by mouth daily at 12 noon.      [name] Kitchen. EPINEPHrine (EPIPEN) 0.3 mg/0.3 mL SOAJ injection Inject 0.3 mg into the muscle daily as needed (Anaphylaxis).         Review of Systems   Constitutional: See HPI above.   Blood pressure 130/82, pulse 103, height 5' 2.25" (1.581 m), weight 127.461 kg (281 lb), last menstrual period 04/13/2013, SpO2 83.00%, not currently breastfeeding. General appearance: alert, cooperative and no distress Lungs: clear to auscultation bilaterally Heart: regular rate and rhythm Abdomen: soft, non-tender; bowel sounds normal, Uterus consistent with gestational age.  Pelvic: Pregnant Uterus, 3 cm, thick, and high.  Extremities: Patricia HaggardHomans  sign is negative, no sign of DVT Neurological: grossly intact.  Presentation: unsure Fetal monitoringBaseline: 130' bpm, Variability: Good {> 6 bpm) and Accelerations: Reactive Uterine activityFrequency: 1 times per hour     Prenatal labs: ABO, Rh: --/--/AB POS (07/15 0510) Antibody: NEG (07/15 0510) Rubella:   Non-Immune RPR: NON REACTIVE (07/15 0510)  HBsAg:   Neg HIV:   Nonreactive GBS:   Neg 1 hr Glucola Negative Genetic screening  - Quad screen negative. Anatomy US 12/13/13 - concern for R duplicated collecting system of fetus. Otherwise placenta is posterior and above the os, estimated weight is 6lb7oz.    Prenatal Transfer Tool  Maternal Diabetes: No Genetic Screening: Normal Maternal Ultrasounds/Referrals: Normal Fetal Ultrasounds or other Referrals:  None Maternal Substance  Abuse:  No Significant Maternal Medications:  None Significant Maternal Lab Results: Lab values include: Group B Strep negative     Results for orders placed during the hospital encounter of 01/01/14 (from the past 24 hour(s))  PROTEIN / CREATININE RATIO, URINE   Collection Time    01/01/14  5:00 PM      Result Value Ref Range   Creatinine, Urine 64.44     Total Protein, Urine 12.3     PROTEIN CREATININE RATIO 0.19 (*) 0.00 - 0.15  CBC   Collection Time    01/01/14  5:14 PM      Result Value Ref Range   WBC 6.3  4.0 - 10.5 K/uL   RBC 3.68 (*) 3.87 - 5.11 MIL/uL   Hemoglobin 11.3 (*) 12.0 - 15.0 g/dL   HCT 16.132.3 (*) 09.636.0 - 04.546.0 %   MCV 87.8  78.0 - 100.0 fL   MCH 30.7  26.0 - 34.0 pg   MCHC 35.0  30.0 - 36.0 g/dL   RDW 40.913.0  81.111.5 - 91.415.5 %   Platelets 161  150 - 400 K/uL  COMPREHENSIVE METABOLIC PANEL   Collection Time    01/01/14  5:14 PM      Result Value Ref Range   Sodium 138  137 - 147 mEq/L   Potassium 3.0 (*) 3.7 - 5.3 mEq/L   Chloride 101  96 - 112 mEq/L   CO2 24  19 - 32 mEq/L   Glucose, Bld 87  70 - 99 mg/dL   BUN 3 (*) 6 - 23 mg/dL   Creatinine, Ser 7.820.57  0.50 - 1.10 mg/dL   Calcium 9.2  8.4 - 95.610.5 mg/dL   Total Protein 6.0  6.0 - 8.3 g/dL   Albumin 2.8 (*) 3.5 - 5.2 g/dL   AST 80 (*) 0 - 37 U/L   ALT 33  0 - 35 U/L   Alkaline Phosphatase 107  39 - 117 U/L   Total Bilirubin 0.5  0.3 - 1.2 mg/dL   GFR calc non Af Amer >90  >90 mL/min   GFR calc Af Amer >90  >90 mL/min   Anion gap 13  5 - 15    Assessment: [name] is a 28 y.o. [id number] with gestational hypertension at 6088w4d by LMP= 19 wk ultrasound  #Labor: Induction of Labor for spontaneous vaginal delivery with cytotec. #Pain: epidural #FWB: Category I #ID:  No prophylaxis at this time. GBS Neg #MOF: Will discuss with patient.  #MOC: Previous tubal ligation, father is now interested in vasectomy #Circ: Unsure at this time.   #GHTN: neg Preeclampsia labs. Does not require Magnesium at this  time.   [name], Patricia HunterCaleb G 01/01/2014, 6:27 PM

## 2014-01-02 ENCOUNTER — Inpatient Hospital Stay (HOSPITAL_COMMUNITY): Payer: Medicaid Other

## 2014-01-02 ENCOUNTER — Encounter (HOSPITAL_COMMUNITY): Payer: Medicaid Other | Admitting: Anesthesiology

## 2014-01-02 ENCOUNTER — Encounter (HOSPITAL_COMMUNITY): Payer: Self-pay

## 2014-01-02 ENCOUNTER — Ambulatory Visit (HOSPITAL_COMMUNITY): Payer: Medicaid Other

## 2014-01-02 ENCOUNTER — Inpatient Hospital Stay (HOSPITAL_COMMUNITY): Payer: Medicaid Other | Admitting: Anesthesiology

## 2014-01-02 DIAGNOSIS — E669 Obesity, unspecified: Secondary | ICD-10-CM

## 2014-01-02 DIAGNOSIS — O99214 Obesity complicating childbirth: Secondary | ICD-10-CM

## 2014-01-02 DIAGNOSIS — Z6841 Body Mass Index (BMI) 40.0 and over, adult: Secondary | ICD-10-CM

## 2014-01-02 DIAGNOSIS — Z3490 Encounter for supervision of normal pregnancy, unspecified, unspecified trimester: Secondary | ICD-10-CM

## 2014-01-02 DIAGNOSIS — O139 Gestational [pregnancy-induced] hypertension without significant proteinuria, unspecified trimester: Secondary | ICD-10-CM

## 2014-01-02 LAB — COMPREHENSIVE METABOLIC PANEL
ALK PHOS: 100 U/L (ref 39–117)
ALT: 28 U/L (ref 0–35)
AST: 59 U/L — AB (ref 0–37)
Albumin: 2.4 g/dL — ABNORMAL LOW (ref 3.5–5.2)
Anion gap: 16 — ABNORMAL HIGH (ref 5–15)
BILIRUBIN TOTAL: 0.8 mg/dL (ref 0.3–1.2)
BUN: 3 mg/dL — ABNORMAL LOW (ref 6–23)
CO2: 20 meq/L (ref 19–32)
CREATININE: 0.53 mg/dL (ref 0.50–1.10)
Calcium: 9.2 mg/dL (ref 8.4–10.5)
Chloride: 104 mEq/L (ref 96–112)
GFR calc Af Amer: >90 mL/min (ref >90)
Glucose, Bld: 67 mg/dL — ABNORMAL LOW (ref 70–99)
Potassium: 3.1 mEq/L — ABNORMAL LOW (ref 3.7–5.3)
SODIUM: 140 meq/L (ref 137–147)
Total Protein: 5.4 g/dL — ABNORMAL LOW (ref 6.0–8.3)

## 2014-01-02 LAB — CBC
HCT: 32.7 % — ABNORMAL LOW (ref 36.0–46.0)
HEMATOCRIT: 32.5 % — AB (ref 36.0–46.0)
HEMOGLOBIN: 11.2 g/dL — AB (ref 12.0–15.0)
Hemoglobin: 11.1 g/dL — ABNORMAL LOW (ref 12.0–15.0)
MCH: 30.5 pg (ref 26.0–34.0)
MCH: 30 pg (ref 26.0–34.0)
MCHC: 34.5 g/dL (ref 30.0–36.0)
MCHC: 33.9 g/dL (ref 30.0–36.0)
MCV: 88.6 fL (ref 78.0–100.0)
MCV: 88.4 fL (ref 78.0–100.0)
PLATELETS: 144 10*3/uL — AB (ref 150–400)
Platelets: 132 10*3/uL — ABNORMAL LOW (ref 150–400)
RBC: 3.67 MIL/uL — ABNORMAL LOW (ref 3.87–5.11)
RBC: 3.7 MIL/uL — AB (ref 3.87–5.11)
RDW: 13.3 % (ref 11.5–15.5)
RDW: 13.1 % (ref 11.5–15.5)
WBC: 6.8 10*3/uL (ref 4.0–10.5)
WBC: 8.4 10*3/uL (ref 4.0–10.5)

## 2014-01-02 LAB — PROTEIN / CREATININE RATIO, URINE
Creatinine, Urine: 77.63 mg/dL
Protein Creatinine Ratio: 2.03 — ABNORMAL HIGH (ref 0.00–0.15)
Total Protein, Urine: 157.9 mg/dL

## 2014-01-02 LAB — RPR

## 2014-01-02 MED ORDER — LACTATED RINGERS IV SOLN
INTRAVENOUS | Status: DC
  Administered 2014-01-02 – 2014-01-03 (×2): via INTRAVENOUS

## 2014-01-02 MED ORDER — OXYCODONE-ACETAMINOPHEN 5-325 MG PO TABS
1.0000 | ORAL_TABLET | ORAL | Status: DC | PRN
  Administered 2014-01-02: 2 via ORAL
  Administered 2014-01-03 (×3): 1 via ORAL
  Administered 2014-01-03: 2 via ORAL
  Administered 2014-01-04: 1 via ORAL
  Filled 2014-01-02: qty 1
  Filled 2014-01-02: qty 2
  Filled 2014-01-02 (×2): qty 1
  Filled 2014-01-02 (×2): qty 2
  Filled 2014-01-02: qty 1

## 2014-01-02 MED ORDER — SENNOSIDES-DOCUSATE SODIUM 8.6-50 MG PO TABS
2.0000 | ORAL_TABLET | ORAL | Status: DC
  Administered 2014-01-03 – 2014-01-04 (×2): 2 via ORAL
  Filled 2014-01-02 (×2): qty 2

## 2014-01-02 MED ORDER — BUTORPHANOL TARTRATE 1 MG/ML IJ SOLN
2.0000 mg | Freq: Once | INTRAMUSCULAR | Status: AC
  Administered 2014-01-02: 2 mg via INTRAVENOUS
  Filled 2014-01-02: qty 2

## 2014-01-02 MED ORDER — OXYTOCIN 40 UNITS IN LACTATED RINGERS INFUSION - SIMPLE MED
1.0000 m[IU]/min | INTRAVENOUS | Status: DC
  Administered 2014-01-02: 2 m[IU]/min via INTRAVENOUS
  Filled 2014-01-02: qty 1000

## 2014-01-02 MED ORDER — PROMETHAZINE HCL 25 MG/ML IJ SOLN
12.5000 mg | Freq: Four times a day (QID) | INTRAMUSCULAR | Status: DC | PRN
  Administered 2014-01-02: 12.5 mg via INTRAVENOUS
  Filled 2014-01-02: qty 1

## 2014-01-02 MED ORDER — MAGNESIUM SULFATE BOLUS VIA INFUSION
4.0000 g | Freq: Once | INTRAVENOUS | Status: AC
  Filled 2014-01-02: qty 500

## 2014-01-02 MED ORDER — ZOLPIDEM TARTRATE 5 MG PO TABS: 5.0000 mg | ORAL_TABLET | Freq: Every evening | ORAL | Status: DC | PRN

## 2014-01-02 MED ORDER — WITCH HAZEL-GLYCERIN EX PADS: 1.0000 "application " | MEDICATED_PAD | CUTANEOUS | Status: DC | PRN

## 2014-01-02 MED ORDER — BENZOCAINE-MENTHOL 20-0.5 % EX AERO: 1.0000 "application " | INHALATION_SPRAY | CUTANEOUS | Status: DC | PRN

## 2014-01-02 MED ORDER — ONDANSETRON HCL 4 MG/2ML IJ SOLN: 4.0000 mg | INTRAMUSCULAR | Status: DC | PRN

## 2014-01-02 MED ORDER — BUTORPHANOL TARTRATE 1 MG/ML IJ SOLN
1.0000 mg | Freq: Once | INTRAMUSCULAR | Status: AC
  Administered 2014-01-02: 1 mg via INTRAVENOUS
  Filled 2014-01-02: qty 1

## 2014-01-02 MED ORDER — MEASLES, MUMPS & RUBELLA VAC ~~LOC~~ INJ
0.5000 mL | INJECTION | Freq: Once | SUBCUTANEOUS | Status: AC
  Administered 2014-01-04: 0.5 mL via SUBCUTANEOUS
  Filled 2014-01-02: qty 0.5

## 2014-01-02 MED ORDER — PRENATAL MULTIVITAMIN CH
1.0000 | ORAL_TABLET | Freq: Every day | ORAL | Status: DC
  Administered 2014-01-03: 1 via ORAL
  Filled 2014-01-02: qty 1

## 2014-01-02 MED ORDER — LIDOCAINE HCL (PF) 1 % IJ SOLN
INTRAMUSCULAR | Status: DC | PRN
  Administered 2014-01-02 (×2): 5 mL

## 2014-01-02 MED ORDER — TETANUS-DIPHTH-ACELL PERTUSSIS 5-2.5-18.5 LF-MCG/0.5 IM SUSP
0.5000 mL | Freq: Once | INTRAMUSCULAR | Status: DC
  Filled 2014-01-02: qty 0.5

## 2014-01-02 MED ORDER — DIBUCAINE 1 % RE OINT: 1.0000 "application " | TOPICAL_OINTMENT | RECTAL | Status: DC | PRN

## 2014-01-02 MED ORDER — LANOLIN HYDROUS EX OINT: TOPICAL_OINTMENT | CUTANEOUS | Status: DC | PRN

## 2014-01-02 MED ORDER — ONDANSETRON HCL 4 MG PO TABS: 4.0000 mg | ORAL_TABLET | ORAL | Status: DC | PRN

## 2014-01-02 MED ORDER — IBUPROFEN 600 MG PO TABS
600.0000 mg | ORAL_TABLET | Freq: Four times a day (QID) | ORAL | Status: DC
  Administered 2014-01-03 – 2014-01-04 (×7): 600 mg via ORAL
  Filled 2014-01-02 (×7): qty 1

## 2014-01-02 MED ORDER — TERBUTALINE SULFATE 1 MG/ML IJ SOLN: 0.2500 mg | Freq: Once | INTRAMUSCULAR | Status: DC | PRN

## 2014-01-02 MED ORDER — MAGNESIUM SULFATE 40 G IN LACTATED RINGERS - SIMPLE
2.0000 g/h | INTRAVENOUS | Status: DC
  Administered 2014-01-02: 4 g/h via INTRAVENOUS
  Filled 2014-01-02: qty 500

## 2014-01-02 MED ORDER — DIPHENHYDRAMINE HCL 25 MG PO CAPS: 25.0000 mg | ORAL_CAPSULE | Freq: Four times a day (QID) | ORAL | Status: DC | PRN

## 2014-01-02 MED ORDER — SIMETHICONE 80 MG PO CHEW: 80.0000 mg | CHEWABLE_TABLET | ORAL | Status: DC | PRN

## 2014-01-02 NOTE — Progress Notes (Signed)
Lady GaryLisanti A Duman is a 28 y.o. [id number] at 3545w5d admitted for induction of labor due to Hypertension.  Subjective:  Getting more uncomfortable again.  +FM   Objective: BP 150/85  Pulse 99  Temp(Src) 98.7 F (37.1 C) (Oral)  Resp 18  Ht 5' 2.25" (1.581 m)  Wt 127.461 kg (281 lb)  BMI 50.99 kg/m2  SpO2 97%  LMP 04/13/2013   Total I/O In: -  Out: 500 [Urine:500]  FHT:  FHR: 125 bpm, variability: moderate,  accelerations:  Present,  decelerations:  Absent UC:   regular, every 2-3 minutes SVE:   Dilation: 3.5 Effacement (%): 50 Station: -3 Exam by:: L. Mcdaiel rN  Labs: Lab Results  Component Value Date   WBC 6.8 01/02/2014   HGB 11.2* 01/02/2014   HCT 32.5* 01/02/2014   MCV 88.6 01/02/2014   PLT 144* 01/02/2014    Assessment / Plan: Induction of labor due to hypertension,  progressing well on pitocin  Labor: IUPC placed without difficulyt Fetal Wellbeing:  Category I Pain Control:  Epidural I/D:  n/a Anticipated MOD:  NSVD  Lashaunta Sicard L 01/02/2014, 3:02 PM

## 2014-01-02 NOTE — Anesthesia Procedure Notes (Signed)
Epidural Patient location during procedure: OB Start time: 01/02/2014 9:00 AM  Staffing Anesthesiologist: Brayton CavesJACKSON, Clotiel Troop Performed by: anesthesiologist   Preanesthetic Checklist Completed: patient identified, site marked, surgical consent, pre-op evaluation, timeout performed, IV checked, risks and benefits discussed and monitors and equipment checked  Epidural Patient position: sitting Prep: site prepped and draped and DuraPrep Patient monitoring: continuous pulse ox and blood pressure Approach: midline Location: L3-L4 Injection technique: LOR air  Needle:  Needle type: Tuohy  Needle gauge: 17 G Needle length: 9 cm and 9 Needle insertion depth: 8 cm Catheter type: closed end flexible Catheter size: 19 Gauge Catheter at skin depth: 13 cm Test dose: negative  Assessment Events: blood not aspirated, injection not painful, no injection resistance, negative IV test and no paresthesia  Additional Notes Patient identified.  Risk benefits discussed including failed block, incomplete pain control, headache, nerve damage, paralysis, blood pressure changes, nausea, vomiting, reactions to medication both toxic or allergic, and postpartum back pain.  Patient expressed understanding and wished to proceed.  All questions were answered.  Sterile technique used throughout procedure and epidural site dressed with sterile barrier dressing. No paresthesia or other complications noted.The patient did not experience any signs of intravascular injection such as tinnitus or metallic taste in mouth nor signs of intrathecal spread such as rapid motor block. Please see nursing notes for vital signs.

## 2014-01-02 NOTE — Anesthesia Preprocedure Evaluation (Signed)
Anesthesia Evaluation  Patient identified by MRN, date of birth, ID band Patient awake    Reviewed: Allergy & Precautions, H&P , Patient's Chart, lab work & pertinent test results  Airway Mallampati: III TM Distance: >3 FB Neck ROM: full    Dental   Pulmonary asthma , former smoker,  breath sounds clear to auscultation        Cardiovascular hypertension, Rhythm:regular Rate:Normal     Neuro/Psych  Headaches,    GI/Hepatic   Endo/Other  Morbid obesity  Renal/GU      Musculoskeletal   Abdominal   Peds  Hematology   Anesthesia Other Findings   Reproductive/Obstetrics (+) Pregnancy                           Anesthesia Physical Anesthesia Plan  ASA: III  Anesthesia Plan: Epidural   Post-op Pain Management:    Induction:   Airway Management Planned:   Additional Equipment:   Intra-op Plan:   Post-operative Plan:   Informed Consent: I have reviewed the patients History and Physical, chart, labs and discussed the procedure including the risks, benefits and alternatives for the proposed anesthesia with the patient or authorized representative who has indicated his/her understanding and acceptance.     Plan Discussed with:   Anesthesia Plan Comments:         Anesthesia Quick Evaluation

## 2014-01-02 NOTE — Progress Notes (Signed)
   Subjective: Pt reports comfortable after stadol.  Previously requested epidural.    Objective: BP 124/79  Pulse 93  Temp(Src) 98.6 F (37 C) (Oral)  Resp 18  Ht 5' 2.25" (1.581 m)  Wt 127.461 kg (281 lb)  BMI 50.99 kg/m2  SpO2 100%  LMP 04/13/2013      FHT:  FHR: 120's bpm, variability: moderate,  accelerations:  Present,  decelerations:  Absent UC:   regular, every 2-4 minutes SVE:   Dilation: 2 Effacement (%): 50 Station: -3 Exam by:: E.Clapper, RN  Foley bulb placed without difficulty.  Labs: Lab Results  Component Value Date   WBC 6.3 01/01/2014   HGB 11.0* 01/01/2014   HCT 31.8* 01/01/2014   MCV 87.6 01/01/2014   PLT 134* 01/01/2014    Assessment / Plan: Augmentation of Labor  Labor: Progressing normally Preeclampsia:  n/a Fetal Wellbeing:  Category I Pain Control:  Stadol I/D:  GBS neg Anticipated MOD:  NSVD  [name],[name] 01/02/2014, 1:51 AM

## 2014-01-02 NOTE — Progress Notes (Signed)
Delivery of live viable female by Dr Reola CalkinsBeck. APGARS 9, 10

## 2014-01-02 NOTE — Progress Notes (Signed)
[name] is a 28 y.o. [id number] at 6665w5d admitted for induction of labor due to Hypertension.  Subjective:  Doing well. Just got an epidural. She is comfortable.  +FM  Objective: BP 133/69  Pulse 105  Temp(Src) 98.7 F (37.1 C) (Oral)  Resp 16  Ht 5' 2.25" (1.581 m)  Wt 127.461 kg (281 lb)  BMI 50.99 kg/m2  SpO2 97%  LMP 04/13/2013      FHT:  FHR: 130 bpm, variability: moderate,  accelerations:  Present,  decelerations:  Absent UC:   regular, every 3-4 minutes SVE:   Dilation: 3.5 Effacement (%): 50 Station: -3 Exam by:: Patricia DanasL. McDaniel RN  Labs: Lab Results  Component Value Date   WBC 6.8 01/02/2014   HGB 11.2* 01/02/2014   HCT 32.5* 01/02/2014   MCV 88.6 01/02/2014   PLT 144* 01/02/2014    Assessment / Plan: IOL for gHTN.   Labor: FB came out and now on pitocin.  Preeclampsia:  intake and ouput balanced and labs stable Fetal Wellbeing:  Category I Pain Control:  Epidural I/D:  n/a Anticipated MOD:  NSVD  [name] 01/02/2014, 10:09 AM

## 2014-01-02 NOTE — H&P (Signed)
Attestation of Attending Supervision of Advanced Practitioner (PA/CNM/NP): Evaluation and management procedures were performed by the Advanced Practitioner under my supervision and collaboration.  I have reviewed the Advanced Practitioner's note and chart, and I agree with the management and plan.  Reva BoresPRATT,TANYA S, MD [facility] Attending 01/02/2014 1:01 AM

## 2014-01-03 MED ORDER — SODIUM CHLORIDE 0.9 % IJ SOLN
3.0000 mL | Freq: Two times a day (BID) | INTRAMUSCULAR | Status: DC
  Administered 2014-01-03 (×2): 3 mL via INTRAVENOUS

## 2014-01-03 MED ORDER — SODIUM CHLORIDE 0.9 % IJ SOLN: 3.0000 mL | INTRAMUSCULAR | Status: DC | PRN

## 2014-01-03 MED ORDER — MAGNESIUM SULFATE 40 G IN LACTATED RINGERS - SIMPLE
2.0000 g/h | INTRAVENOUS | Status: AC
  Filled 2014-01-03: qty 500

## 2014-01-03 NOTE — Progress Notes (Signed)
Post Partum Day 1 with postpartum preeclampsia  Subjective: no complaints, voiding, tolerating PO and + flatus. Denies any headaches, visual changes, RUQ pain. Bottlefeeding, baby is doing well.  Plans for husband to get vasectomy (this pregnancy occurred after patient had BTS).  Objective: Blood pressure 144/83, pulse 96, temperature 97.8 F (36.6 C), temperature source Oral, resp. rate 20, height 5' 2.25" (1.581 m), weight 278 lb 1.6 oz (126.145 kg), last menstrual period 04/13/2013, SpO2 96.00%, unknown if currently breastfeeding.  Physical Exam:  General: alert and no distress Lungs: CTAB Heart: RRR Lochia: appropriate Uterine Fundus: firm Ext: 1+ DTRs, 1+ edema BLE   Recent Labs  01/02/14 0808 01/02/14 1659  HGB 11.2* 11.1*  HCT 32.5* 32.7*    Assessment/Plan: Continue magnesium sulfate for 24 hours Transfer to floor later if stable Routine postpartum care   LOS: 2 days   [name] A, MD 01/03/2014, 7:28 AM

## 2014-01-03 NOTE — Anesthesia Postprocedure Evaluation (Signed)
  Anesthesia Post-op Note  Patient: [name]  Procedure(s) Performed: * No procedures listed *  Patient Location: A-ICU  Anesthesia Type:Epidural  Level of Consciousness: awake, alert  and oriented  Airway and Oxygen Therapy: Patient Spontanous Breathing  Post-op Pain: none  Post-op Assessment: Post-op Vital signs reviewed and Patient's Cardiovascular Status Stable  Post-op Vital Signs: Reviewed and stable  Last Vitals:  Filed Vitals:   01/03/14 1700  BP:   Pulse: 89  Temp:   Resp: 18    Complications: No apparent anesthesia complications

## 2014-01-03 NOTE — Anesthesia Postprocedure Evaluation (Signed)
  Anesthesia Post Note  Patient: [name]  Procedure(s) Performed: * No procedures listed *  Anesthesia type: Epidural  Patient location: Mother/Baby  Post pain: Pain level controlled  Post assessment: Post-op Vital signs reviewed  Last Vitals:  Filed Vitals:   01/03/14 1700  BP:   Pulse: 89  Temp:   Resp: 18    Post vital signs: Reviewed  Level of consciousness: awake  Complications: No apparent anesthesia complications  * per chart review

## 2014-01-03 NOTE — Addendum Note (Signed)
Addendum created 01/03/14 1919 by Gertie Feyynthia R Walker, CRNA   Modules edited: Notes Section   Notes Section:  File: [id number]

## 2014-01-04 MED ORDER — HYDROCHLOROTHIAZIDE 25 MG PO TABS
25.0000 mg | ORAL_TABLET | Freq: Once | ORAL | Status: AC
  Administered 2014-01-04: 25 mg via ORAL
  Filled 2014-01-04: qty 1

## 2014-01-04 MED ORDER — IBUPROFEN 600 MG PO TABS: 600.0000 mg | ORAL_TABLET | Freq: Four times a day (QID) | ORAL | Status: AC | PRN

## 2014-01-04 NOTE — Plan of Care (Signed)
Problem: Phase II Progression Outcomes Goal: Other Phase II Outcomes/Goals Outcome: Completed/Met Date Met:  01/04/14 Off Magnesium drip.  Problem: Discharge Progression Outcomes Goal: Activity appropriate for discharge plan Outcome: Completed/Met Date Met:  01/04/14 Tolerates walking in halls well. Goal: Complications resolved/controlled Outcome: Completed/Met Date Met:  01/04/14 Off Magnesium drip.    Goal: Pain controlled with appropriate interventions Outcome: Completed/Met Date Met:  01/04/14 Good pain control on po Motrin and Percocet.

## 2014-01-04 NOTE — Discharge Instructions (Signed)
Offices that do circumcisions: [facility] [phone] [facility]([place]) [phone] within 4 weeks of birth, [facility] [phone] [facility]([place]) $250 within 7 days of birth, [facility] [phone] [facility]() $175 within 2 weeks of birth   [facility] for these signs of pre-eclampsia:  Severe headache that does not go away with Tylenol  Visual changes- seeing spots, double, blurred vision  Pain under your right breast or upper abdomen that does not go away with Tums or heartburn medicine  Nausea and/or vomiting  Severe swelling in your hands, feet, and face    Preeclampsia and Eclampsia Preeclampsia is a serious condition that develops only during pregnancy. It is also called toxemia of pregnancy. This condition causes high blood pressure along with other symptoms, such as swelling and headaches. These may develop as the condition gets worse. Preeclampsia may occur 20 weeks or later into your pregnancy.  Diagnosing and treating preeclampsia early is very important. If not treated early, it can cause serious problems for you and your baby. One problem it can lead to is eclampsia, which is a condition that causes muscle jerking or shaking (convulsions) in the mother. Delivering your baby is the best treatment for preeclampsia or eclampsia.  RISK FACTORS The cause of preeclampsia is not known. You may be more likely to develop preeclampsia if you have certain risk factors. These include:   Being pregnant for the first time.  Having preeclampsia in a past pregnancy.  Having a family history of preeclampsia.  Having high blood pressure.  Being pregnant with twins or triplets.  Being 5935 or older.  Being African American.  Having kidney disease or diabetes.  Having medical conditions such as lupus or blood diseases.  Being very overweight (obese). SIGNS AND SYMPTOMS  The earliest signs of preeclampsia are:  High blood pressure.  Increased protein in your urine.  Your health care provider will check for this at every prenatal visit. Other symptoms that can develop include:   Severe headaches.  Sudden weight gain.  Swelling of your hands, face, legs, and feet.  Feeling sick to your stomach (nauseous) and throwing up (vomiting).  Vision problems (blurred or double vision).  Numbness in your face, arms, legs, and feet.  Dizziness.  Slurred speech.  Sensitivity to bright lights.  Abdominal pain. DIAGNOSIS  There are no screening tests for preeclampsia. Your health care provider will ask you about symptoms and check for signs of preeclampsia during your prenatal visits. You may also have tests, including:  Urine testing.  Blood testing.  Checking your baby's heart rate.  Checking the health of your baby and your placenta using images created with sound waves (ultrasound). TREATMENT  You can work out the best treatment approach together with your health care provider. It is very important to keep all prenatal appointments. If you have an increased risk of preeclampsia, you may need more frequent prenatal exams.  Your health care provider may prescribe bed rest.  You may have to eat as little salt as possible.  You may need to take medicine to lower your blood pressure if the condition does not respond to more conservative measures.  You may need to stay in the hospital if your condition is severe. There, treatment will focus on controlling your blood pressure and fluid retention. You may also need to take medicine to prevent seizures.  If the condition gets worse, your baby may need to be delivered early to protect you and the baby. You may have your labor  started with medicine (be induced), or you may have a cesarean delivery.  Preeclampsia usually goes away after the baby is born. HOME CARE INSTRUCTIONS   Only take over-the-counter or prescription medicines as directed by your health care provider.  Lie on your left side while  resting. This keeps pressure off your baby.  Elevate your feet while resting.  Get regular exercise. Ask your health care provider what type of exercise is safe for you.  Avoid caffeine and alcohol.  Do not smoke.  Drink 6-8 glasses of water every day.  Eat a balanced diet that is low in salt. Do not add salt to your food.  Avoid stressful situations as much as possible.  Get plenty of rest and sleep.  Keep all prenatal appointments and tests as scheduled. SEEK MEDICAL CARE IF:  You are gaining more weight than expected.  You have any headaches, abdominal pain, or nausea.  You are bruising more than usual.  You feel dizzy or light-headed. SEEK IMMEDIATE MEDICAL CARE IF:   You develop sudden or severe swelling anywhere in your body. This usually happens in the legs.  You gain 5 lb (2.3 kg) or more in a week.  You have a severe headache, dizziness, problems with your vision, or confusion.  You have severe abdominal pain.  You have lasting nausea or vomiting.  You have a seizure.  You have trouble moving any part of your body.  You develop numbness in your body.  You have trouble speaking.  You have any abnormal bleeding.  You develop a stiff neck.  You pass out. MAKE SURE YOU:   Understand these instructions.  Will watch your condition.  Will get help right away if you are not doing well or get worse. Document Released: 06/17/2000 Document Revised: 06/25/2013 Document Reviewed: 04/12/2013 [facility] Patient Information 2015 [place]. This information is not intended to replace advice given to you by your health care provider. Make sure you discuss any questions you have with your health care provider.  Postpartum Care After Vaginal Delivery After you deliver your newborn (postpartum period), the usual stay in the hospital is 24-72 hours. If there were problems with your labor or delivery, or if you have other medical problems, you might be in the  hospital longer.  While you are in the hospital, you will receive help and instructions on how to care for yourself and your newborn during the postpartum period.  While you are in the hospital:  Be sure to tell your nurses if you have pain or discomfort, as well as where you feel the pain and what makes the pain worse.  If you had an incision made near your vagina (episiotomy) or if you had some tearing during delivery, the nurses may put ice packs on your episiotomy or tear. The ice packs may help to reduce the pain and swelling.  If you are breastfeeding, you may feel uncomfortable contractions of your uterus for a couple of weeks. This is normal. The contractions help your uterus get back to normal size.  It is normal to have some bleeding after delivery.  For the first 1-3 days after delivery, the flow is red and the amount may be similar to a period.  It is common for the flow to start and stop.  In the first few days, you may pass some small clots. Let your nurses know if you begin to pass large clots or your flow increases.  Do not  flush blood clots  down the toilet before having the nurse look at them.  During the next 3-10 days after delivery, your flow should become more watery and pink or brown-tinged in color.  Ten to fourteen days after delivery, your flow should be a small amount of yellowish-white discharge.  The amount of your flow will decrease over the first few weeks after delivery. Your flow may stop in 6-8 weeks. Most women have had their flow stop by 12 weeks after delivery.  You should change your sanitary pads frequently.  Wash your hands thoroughly with soap and water for at least 20 seconds after changing pads, using the toilet, or before holding or feeding your newborn.  You should feel like you need to empty your bladder within the first 6-8 hours after delivery.  In case you become weak, lightheaded, or faint, call your nurse before you get out of bed for  the first time and before you take a shower for the first time.  Within the first few days after delivery, your breasts may begin to feel tender and full. This is called engorgement. Breast tenderness usually goes away within 48-72 hours after engorgement occurs. You may also notice milk leaking from your breasts. If you are not breastfeeding, do not stimulate your breasts. Breast stimulation can make your breasts produce more milk.  Spending as much time as possible with your newborn is very important. During this time, you and your newborn can feel close and get to know each other. Having your newborn stay in your room (rooming in) will help to strengthen the bond with your newborn. It will give you time to get to know your newborn and become comfortable caring for your newborn.  Your hormones change after delivery. Sometimes the hormone changes can temporarily cause you to feel sad or tearful. These feelings should not last more than a few days. If these feelings last longer than that, you should talk to your caregiver.  If desired, talk to your caregiver about methods of family planning or contraception.  Talk to your caregiver about immunizations. Your caregiver may want you to have the following immunizations before leaving the hospital:  Tetanus, diphtheria, and pertussis (Tdap) or tetanus and diphtheria (Td) immunization. It is very important that you and your family (including grandparents) or others caring for your newborn are up-to-date with the Tdap or Td immunizations. The Tdap or Td immunization can help protect your newborn from getting ill.  Rubella immunization.  Varicella (chickenpox) immunization.  Influenza immunization. You should receive this annual immunization if you did not receive the immunization during your pregnancy. Document Released: 04/17/2007 Document Revised: 03/14/2012 Document Reviewed: 02/15/2012 [facility] Patient Information 2015 [place], [place]. This  information is not intended to replace advice given to you by your health care provider. Make sure you discuss any questions you have with your health care provider.  Breastfeeding Deciding to breastfeed is one of the best choices you can make for you and your baby. A change in hormones during pregnancy causes your breast tissue to grow and increases the number and size of your milk ducts. These hormones also allow proteins, sugars, and fats from your blood supply to make breast milk in your milk-producing glands. Hormones prevent breast milk from being released before your baby is born as well as prompt milk flow after birth. Once breastfeeding has begun, thoughts of your baby, as well as his or her sucking or crying, can stimulate the release of milk from your milk-producing glands.  BENEFITS  OF BREASTFEEDING For Your Baby  Your first milk (colostrum) helps your baby's digestive system function better.   There are antibodies in your milk that help your baby fight off infections.   Your baby has a lower incidence of asthma, allergies, and sudden infant death syndrome.   The nutrients in breast milk are better for your baby than infant formulas and are designed uniquely for your baby's needs.   Breast milk improves your baby's brain development.   Your baby is less likely to develop other conditions, such as childhood obesity, asthma, or type 2 diabetes mellitus.  For You   Breastfeeding helps to create a very special bond between you and your baby.   Breastfeeding is convenient. Breast milk is always available at the correct temperature and costs nothing.   Breastfeeding helps to burn calories and helps you lose the weight gained during pregnancy.   Breastfeeding makes your uterus contract to its prepregnancy size faster and slows bleeding (lochia) after you give birth.   Breastfeeding helps to lower your risk of developing type 2 diabetes mellitus, osteoporosis, and breast or  ovarian cancer later in life. SIGNS THAT YOUR BABY IS HUNGRY Early Signs of Hunger  Increased alertness or activity.  Stretching.  Movement of the head from side to side.  Movement of the head and opening of the mouth when the corner of the mouth or cheek is stroked (rooting).  Increased sucking sounds, smacking lips, cooing, sighing, or squeaking.  Hand-to-mouth movements.  Increased sucking of fingers or hands. Late Signs of Hunger  Fussing.  Intermittent crying. Extreme Signs of Hunger Signs of extreme hunger will require calming and consoling before your baby will be able to breastfeed successfully. Do not wait for the following signs of extreme hunger to occur before you initiate breastfeeding:   Restlessness.  A loud, strong cry.   Screaming. BREASTFEEDING BASICS Breastfeeding Initiation  Find a comfortable place to sit or lie down, with your neck and back well supported.  Place a pillow or rolled up blanket under your baby to bring him or her to the level of your breast (if you are seated). Nursing pillows are specially designed to help support your arms and your baby while you breastfeed.  Make sure that your baby's abdomen is facing your abdomen.   Gently massage your breast. With your fingertips, massage from your chest wall toward your nipple in a circular motion. This encourages milk flow. You may need to continue this action during the feeding if your milk flows slowly.  Support your breast with 4 fingers underneath and your thumb above your nipple. Make sure your fingers are well away from your nipple and your baby's mouth.   Stroke your baby's lips gently with your finger or nipple.   When your baby's mouth is open wide enough, quickly bring your baby to your breast, placing your entire nipple and as much of the colored area around your nipple (areola) as possible into your baby's mouth.   More areola should be visible above your baby's upper lip  than below the lower lip.   Your baby's tongue should be between his or her lower gum and your breast.   Ensure that your baby's mouth is correctly positioned around your nipple (latched). Your baby's lips should create a seal on your breast and be turned out (everted).  It is common for your baby to suck about 2-3 minutes in order to start the flow of breast milk. Latching Teaching your  baby how to latch on to your breast properly is very important. An improper latch can cause nipple pain and decreased milk supply for you and poor weight gain in your baby. Also, if your baby is not latched onto your nipple properly, he or she may swallow some air during feeding. This can make your baby fussy. Burping your baby when you switch breasts during the feeding can help to get rid of the air. However, teaching your baby to latch on properly is still the best way to prevent fussiness from swallowing air while breastfeeding. Signs that your baby has successfully latched on to your nipple:    Silent tugging or silent sucking, without causing you pain.   Swallowing heard between every 3-4 sucks.    Muscle movement above and in front of his or her ears while sucking.  Signs that your baby has not successfully latched on to nipple:   Sucking sounds or smacking sounds from your baby while breastfeeding.  Nipple pain. If you think your baby has not latched on correctly, slip your finger into the corner of your baby's mouth to break the suction and place it between your baby's gums. Attempt breastfeeding initiation again. Signs of Successful Breastfeeding Signs from your baby:   A gradual decrease in the number of sucks or complete cessation of sucking.   Falling asleep.   Relaxation of his or her body.   Retention of a small amount of milk in his or her mouth.   Letting go of your breast by himself or herself. Signs from you:  Breasts that have increased in firmness, weight, and size  1-3 hours after feeding.   Breasts that are softer immediately after breastfeeding.  Increased milk volume, as well as a change in milk consistency and color by the fifth day of breastfeeding.   Nipples that are not sore, cracked, or bleeding. Signs That Your Pecola Leisure is Getting Enough Milk  Wetting at least 3 diapers in a 24-hour period. The urine should be clear and pale yellow by age 35 days.  At least 3 stools in a 24-hour period by age 35 days. The stool should be soft and yellow.  At least 3 stools in a 24-hour period by age 36 days. The stool should be seedy and yellow.  No loss of weight greater than 10% of birth weight during the first 71 days of age.  Average weight gain of 4-7 ounces (113-198 g) per week after age 95 days.  Consistent daily weight gain by age 35 days, without weight loss after the age of 2 weeks. After a feeding, your baby may spit up a small amount. This is common. BREASTFEEDING FREQUENCY AND DURATION Frequent feeding will help you make more milk and can prevent sore nipples and breast engorgement. Breastfeed when you feel the need to reduce the fullness of your breasts or when your baby shows signs of hunger. This is called "breastfeeding on demand." Avoid introducing a pacifier to your baby while you are working to establish breastfeeding (the first 4-6 weeks after your baby is born). After this time you may choose to use a pacifier. Research has shown that pacifier use during the first year of a baby's life decreases the risk of sudden infant death syndrome (SIDS). Allow your baby to feed on each breast as long as he or she wants. Breastfeed until your baby is finished feeding. When your baby unlatches or falls asleep while feeding from the first breast, offer the second breast.  Because newborns are often sleepy in the first few weeks of life, you may need to awaken your baby to get him or her to feed. Breastfeeding times will vary from baby to baby. However, the  following rules can serve as a guide to help you ensure that your baby is properly fed:  Newborns (babies 40 weeks of age or younger) may breastfeed every 1-3 hours.  Newborns should not go longer than 3 hours during the day or 5 hours during the night without breastfeeding.  You should breastfeed your baby a minimum of 8 times in a 24-hour period until you begin to introduce solid foods to your baby at around 66 months of age. BREAST MILK PUMPING Pumping and storing breast milk allows you to ensure that your baby is exclusively fed your breast milk, even at times when you are unable to breastfeed. This is especially important if you are going back to work while you are still breastfeeding or when you are not able to be present during feedings. Your lactation consultant can give you guidelines on how long it is safe to store breast milk.  A breast pump is a machine that allows you to pump milk from your breast into a sterile bottle. The pumped breast milk can then be stored in a refrigerator or freezer. Some breast pumps are operated by hand, while others use electricity. Ask your lactation consultant which type will work best for you. Breast pumps can be purchased, but some hospitals and breastfeeding support groups lease breast pumps on a monthly basis. A lactation consultant can teach you how to hand express breast milk, if you prefer not to use a pump.  CARING FOR YOUR BREASTS WHILE YOU BREASTFEED Nipples can become dry, cracked, and sore while breastfeeding. The following recommendations can help keep your breasts moisturized and healthy:  Avoid using soap on your nipples.   Wear a supportive bra. Although not required, special nursing bras and tank tops are designed to allow access to your breasts for breastfeeding without taking off your entire bra or top. Avoid wearing underwire-style bras or extremely tight bras.  Air dry your nipples for 3-82minutes after each feeding.   Use only cotton  bra pads to absorb leaked breast milk. Leaking of breast milk between feedings is normal.   Use lanolin on your nipples after breastfeeding. Lanolin helps to maintain your skin's normal moisture barrier. If you use pure lanolin, you do not need to wash it off before feeding your baby again. Pure lanolin is not toxic to your baby. You may also hand express a few drops of breast milk and gently massage that milk into your nipples and allow the milk to air dry. In the first few weeks after giving birth, some women experience extremely full breasts (engorgement). Engorgement can make your breasts feel heavy, warm, and tender to the touch. Engorgement peaks within 3-5 days after you give birth. The following recommendations can help ease engorgement:  Completely empty your breasts while breastfeeding or pumping. You may want to start by applying warm, moist heat (in the shower or with warm water-soaked hand towels) just before feeding or pumping. This increases circulation and helps the milk flow. If your baby does not completely empty your breasts while breastfeeding, pump any extra milk after he or she is finished.  Wear a snug bra (nursing or regular) or tank top for 1-2 days to signal your body to slightly decrease milk production.  Apply ice packs to your breasts,  unless this is too uncomfortable for you.  Make sure that your baby is latched on and positioned properly while breastfeeding. If engorgement persists after 48 hours of following these recommendations, contact your health care provider or a Advertising copywriter. OVERALL HEALTH CARE RECOMMENDATIONS WHILE BREASTFEEDING  Eat healthy foods. Alternate between meals and snacks, eating 3 of each per day. Because what you eat affects your breast milk, some of the foods may make your baby more irritable than usual. Avoid eating these foods if you are sure that they are negatively affecting your baby.  Drink milk, fruit juice, and water to satisfy  your thirst (about 10 glasses a day).   Rest often, relax, and continue to take your prenatal vitamins to prevent fatigue, stress, and anemia.  Continue breast self-awareness checks.  Avoid chewing and smoking tobacco.  Avoid alcohol and drug use. Some medicines that may be harmful to your baby can pass through breast milk. It is important to ask your health care provider before taking any medicine, including all over-the-counter and prescription medicine as well as vitamin and herbal supplements. It is possible to become pregnant while breastfeeding. If birth control is desired, ask your health care provider about options that will be safe for your baby. SEEK MEDICAL CARE IF:   You feel like you want to stop breastfeeding or have become frustrated with breastfeeding.  You have painful breasts or nipples.  Your nipples are cracked or bleeding.  Your breasts are red, tender, or warm.  You have a swollen area on either breast.  You have a fever or chills.  You have nausea or vomiting.  You have drainage other than breast milk from your nipples.  Your breasts do not become full before feedings by the fifth day after you give birth.  You feel sad and depressed.  Your baby is too sleepy to eat well.  Your baby is having trouble sleeping.   Your baby is wetting less than 3 diapers in a 24-hour period.  Your baby has less than 3 stools in a 24-hour period.  Your baby's skin or the white part of his or her eyes becomes yellow.   Your baby is not gaining weight by 78 days of age. SEEK IMMEDIATE MEDICAL CARE IF:   Your baby is overly tired (lethargic) and does not want to wake up and feed.  Your baby develops an unexplained fever. Document Released: 06/20/2005 Document Revised: 06/25/2013 Document Reviewed: 12/12/2012 [facility] Patient Information 2015 [place], [place]. This information is not intended to replace advice given to you by your health care provider. Make sure you  discuss any questions you have with your health care provider.

## 2014-01-04 NOTE — Progress Notes (Signed)
Discharge instructions provided to patient at bedside.  Activity, medications, follow up appointments, when to call the doctor and community resources discussed.  No questions at this time.  Patient left unit in stable condition with all personal belongings accompanied by staff.  K. Vraj Denardo, RN------------------------  

## 2014-01-04 NOTE — Discharge Summary (Signed)
Obstetric Discharge Summary Reason for Admission: induction of labor d/t GHTN Prenatal Procedures: ultrasound Intrapartum Procedures: spontaneous vaginal delivery Postpartum Procedures: magnesium Complications-Operative and Postpartum: pp pre-e, requiring magnesium x 24hrs Eating, drinking, voiding, ambulating well.  +flatus.  Lochia and pain wnl.  Denies dizziness, lightheadedness, or sob. No complaints. Denies scotomata, ruq/epigastric pain, n/v.  Mild ha, from 'being startled' awake from sleeping state.  She requests a fluid pill d/t pain from edema in feet.   Hospital Course: Lady GaryLisanti A Higley is a 28 y.o. [id number] female admited at 37.4wks for IOL d/t GHTN w/ normal pre-e labs on admission. A cervical foley bulb was placed, then pitocin was begun and she progressed to birth spontaneously w/o complication. Her pp course has been complicated by elevated pre-e labs couple of hours pp, so received 24hrs magnesium prophylaxis.  By PPD#2 she is doing well and is deemed to have received the full benefit of her hospital stay.  Baby in NICU.  Filed Vitals:   01/04/14 0529  BP: 133/74  Pulse: 98  Temp: 97.9 F (36.6 C)  Resp: 20   H/H: Lab Results  Component Value Date/Time   HGB 11.1* 01/02/2014  4:59 PM   HCT 32.7* 01/02/2014  4:59 PM    Physical Exam: General: alert, cooperative and no distress Abdomen/Uterine Fundus: Appropriately tender, non-distended, FF @ U-2 Incision: n/a Lochia: appropriate Extremities: No evidence of DVT seen on physical exam. Negative Homan's sign, no cords, calf tenderness, 2+ BLE edema, no clonus, DTRs 2+  Discharge Diagnoses: Term Pregnancy-delivered and pp pre-e w/ 24hrs magnesium  Discharge Information: Date: 01/13/2011 Activity: pelvic rest Diet: routine  Medications: 1 dose of hctz 25mg  po now per pt's request for fluid pill, then home w/ PNV and Ibuprofen Breast feeding: Yes, breast/bottle Contraception: pt is s/p BTL 2014, which obviously failed her,  so partner plans for vasectomy Circumcision: undecided, info given  Condition: stable Instructions: refer to handout Discharge to: home  Infant: in NICU d/t what mom reports as trouble w/ bm's and build up of gas.  Baby Love visit 7/6 for BP check  Follow-up Information   [facility] an [facility] as [facility] as possible [facility] [place] (4-6 weeks for your postpartum visit)    Contact information:   [address] [place] [id number] [phone]      Marge DuncansBooker, Kimberly Randall, CNM, WHNP-BC 01/04/2014,9:13 AM

## 2014-01-04 NOTE — Plan of Care (Signed)
Problem: Phase I Progression Outcomes Goal: Other Phase I Outcomes/Goals Outcome: Completed/Met Date Met:  01/04/14 Off Magnesium drip and transferred to regular floor.     

## 2014-01-06 ENCOUNTER — Ambulatory Visit: Payer: Self-pay

## 2014-01-06 NOTE — Lactation Note (Signed)
This note was copied from the chart of Patricia [name]. Lactation Consultation Note    Initial consult with this mom of a term baby, now 92 hours post aprtum. Mom was going to exclusively formula feed, but is leaking breast milk, so had decided to pump. I gave mom a hand pump, and instructed her in it's use. Mom is active with [employer], so I advised her to cal for a DEP, if she decides to continue pumping. Mom knows to have her baaaby's nurse call for lactation help, as needed.   Patient Name: [name]: 01/06/2014 Reason for consult: Initial assessment;NICU baby   Maternal Data Formula Feeding for Exclusion: Yes (baby in NICU) Reason for exclusion: Mother's choice to formula feed on admision  Feeding Feeding Type: Formula Nipple Type: Slow - flow Length of feed: 5 min  LATCH Score/Interventions                      Lactation Tools Discussed/Used Tools: Pump WIC Program: Yes Pump Review: Setup, frequency, and cleaning   Consult Status Consult Status: Follow-up Follow-up type: In-patient (NICU)    Alfred LevinsLee, Sewell Pitner Anne 01/06/2014, 12:41 PM

## 2014-01-16 ENCOUNTER — Other Ambulatory Visit: Payer: Self-pay | Admitting: Obstetrics and Gynecology

## 2014-01-16 MED ORDER — HYDROCHLOROTHIAZIDE 25 MG PO TABS: 25.0000 mg | ORAL_TABLET | Freq: Every day | ORAL | Status: DC

## 2014-01-16 NOTE — Progress Notes (Signed)
Baby smart start nurse called to inform of two blood pressures on two different days of 150's/90's. Patient is asymptomatic as per nurse. Rx for HCTZ 25 mg daily called in to the pharmacy. Nurse will follow up next week for repeat BP and call with results.

## 2014-01-23 ENCOUNTER — Telehealth: Payer: Self-pay

## 2014-01-23 NOTE — Telephone Encounter (Signed)
Lesle ChrisJeanie Church, RN from [facility] called in regards to patient as FYI. States she delivered 01/02/14 and Dr. Jolayne Pantheronstant had put patient on HCTZ 25mg  last week. BP check today was 136/86. Patient advised to continue to take medication unless otherwise told by clinic. BP WNL. [facility]

## 2014-05-05 ENCOUNTER — Encounter (HOSPITAL_COMMUNITY): Payer: Self-pay

## 2014-05-28 ENCOUNTER — Other Ambulatory Visit: Payer: Self-pay | Admitting: Obstetrics and Gynecology

## 2014-09-10 ENCOUNTER — Emergency Department (HOSPITAL_COMMUNITY)
Admission: EM | Admit: 2014-09-10 | Discharge: 2014-09-10 | Disposition: A | Discharge status: Home / Self Care | Payer: No Typology Code available for payment source | Attending: Emergency Medicine | Admitting: Emergency Medicine

## 2014-09-10 ENCOUNTER — Emergency Department (HOSPITAL_COMMUNITY): Payer: No Typology Code available for payment source

## 2014-09-10 ENCOUNTER — Encounter (HOSPITAL_COMMUNITY): Payer: Self-pay | Admitting: Emergency Medicine

## 2014-09-10 DIAGNOSIS — Z79899 Other long term (current) drug therapy: Secondary | ICD-10-CM | POA: Diagnosis not present

## 2014-09-10 DIAGNOSIS — Z87891 Personal history of nicotine dependence: Secondary | ICD-10-CM | POA: Insufficient documentation

## 2014-09-10 DIAGNOSIS — Z8739 Personal history of other diseases of the musculoskeletal system and connective tissue: Secondary | ICD-10-CM | POA: Diagnosis not present

## 2014-09-10 DIAGNOSIS — S8991XA Unspecified injury of right lower leg, initial encounter: Secondary | ICD-10-CM | POA: Diagnosis not present

## 2014-09-10 DIAGNOSIS — Z87440 Personal history of urinary (tract) infections: Secondary | ICD-10-CM | POA: Diagnosis not present

## 2014-09-10 DIAGNOSIS — Y9389 Activity, other specified: Secondary | ICD-10-CM | POA: Insufficient documentation

## 2014-09-10 DIAGNOSIS — M25561 Pain in right knee: Secondary | ICD-10-CM

## 2014-09-10 DIAGNOSIS — S59901A Unspecified injury of right elbow, initial encounter: Secondary | ICD-10-CM | POA: Diagnosis not present

## 2014-09-10 DIAGNOSIS — G8929 Other chronic pain: Secondary | ICD-10-CM | POA: Diagnosis not present

## 2014-09-10 DIAGNOSIS — E669 Obesity, unspecified: Secondary | ICD-10-CM | POA: Diagnosis not present

## 2014-09-10 DIAGNOSIS — S79911A Unspecified injury of right hip, initial encounter: Secondary | ICD-10-CM | POA: Insufficient documentation

## 2014-09-10 DIAGNOSIS — Y998 Other external cause status: Secondary | ICD-10-CM | POA: Diagnosis not present

## 2014-09-10 DIAGNOSIS — Y92481 Parking lot as the place of occurrence of the external cause: Secondary | ICD-10-CM | POA: Insufficient documentation

## 2014-09-10 DIAGNOSIS — M25551 Pain in right hip: Secondary | ICD-10-CM

## 2014-09-10 DIAGNOSIS — J45909 Unspecified asthma, uncomplicated: Secondary | ICD-10-CM | POA: Diagnosis not present

## 2014-09-10 DIAGNOSIS — M25521 Pain in right elbow: Secondary | ICD-10-CM

## 2014-09-10 MED ORDER — TRAMADOL HCL 50 MG PO TABS: 50.0000 mg | ORAL_TABLET | Freq: Four times a day (QID) | ORAL | Status: AC | PRN

## 2014-09-10 MED ORDER — HYDROCODONE-ACETAMINOPHEN 5-325 MG PO TABS
2.0000 | ORAL_TABLET | Freq: Once | ORAL | Status: AC
  Administered 2014-09-10: 2 via ORAL
  Filled 2014-09-10: qty 2

## 2014-09-10 MED ORDER — NAPROXEN 500 MG PO TABS: 500.0000 mg | ORAL_TABLET | Freq: Two times a day (BID) | ORAL | Status: DC
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
## 2015-02-20 ENCOUNTER — Other Ambulatory Visit: Payer: Self-pay | Admitting: Obstetrics and Gynecology

## 2015-09-19 ENCOUNTER — Other Ambulatory Visit: Payer: Self-pay | Admitting: Obstetrics and Gynecology

## 2016-02-25 ENCOUNTER — Other Ambulatory Visit: Payer: Self-pay | Admitting: Obstetrics and Gynecology

## 2016-07-29 ENCOUNTER — Other Ambulatory Visit: Payer: Self-pay | Admitting: Obstetrics and Gynecology

## 2017-01-10 ENCOUNTER — Other Ambulatory Visit: Payer: Self-pay | Admitting: Obstetrics and Gynecology

## 2017-01-27 ENCOUNTER — Emergency Department (HOSPITAL_COMMUNITY)
Admission: EM | Admit: 2017-01-27 | Discharge: 2017-01-27 | Disposition: A | Discharge status: Home / Self Care | Payer: No Typology Code available for payment source | Attending: Emergency Medicine | Admitting: Emergency Medicine

## 2017-01-27 ENCOUNTER — Encounter (HOSPITAL_COMMUNITY): Payer: Self-pay | Admitting: Emergency Medicine

## 2017-01-27 DIAGNOSIS — R0789 Other chest pain: Secondary | ICD-10-CM | POA: Diagnosis not present

## 2017-01-27 DIAGNOSIS — I10 Essential (primary) hypertension: Secondary | ICD-10-CM | POA: Diagnosis not present

## 2017-01-27 DIAGNOSIS — Z79899 Other long term (current) drug therapy: Secondary | ICD-10-CM | POA: Diagnosis not present

## 2017-01-27 DIAGNOSIS — J45909 Unspecified asthma, uncomplicated: Secondary | ICD-10-CM | POA: Insufficient documentation

## 2017-01-27 DIAGNOSIS — M25562 Pain in left knee: Secondary | ICD-10-CM | POA: Insufficient documentation

## 2017-01-27 DIAGNOSIS — Z87891 Personal history of nicotine dependence: Secondary | ICD-10-CM | POA: Diagnosis not present

## 2017-01-27 DIAGNOSIS — Y9389 Activity, other specified: Secondary | ICD-10-CM | POA: Diagnosis not present

## 2017-01-27 DIAGNOSIS — Y999 Unspecified external cause status: Secondary | ICD-10-CM | POA: Diagnosis not present

## 2017-01-27 DIAGNOSIS — M25561 Pain in right knee: Secondary | ICD-10-CM | POA: Insufficient documentation

## 2017-01-27 DIAGNOSIS — R51 Headache: Secondary | ICD-10-CM | POA: Insufficient documentation

## 2017-01-27 DIAGNOSIS — Y92410 Unspecified street and highway as the place of occurrence of the external cause: Secondary | ICD-10-CM | POA: Insufficient documentation

## 2017-01-27 MED ORDER — METHOCARBAMOL 500 MG PO TABS: 500.0000 mg | ORAL_TABLET | Freq: Every evening | ORAL | 0 refills | Status: AC | PRN
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
## 2017-01-27 NOTE — ED Notes (Signed)
Pt ambulated to the room without difficulty

## 2017-01-27 NOTE — Discharge Instructions (Signed)
Take Ibuprofen three times daily for the next week. Take this medicine with food. °Take muscle relaxer at bedtime to help you sleep. This medicine makes you drowsy so do not take before driving or work °Use a heating pad for sore muscles - use for 20 minutes several times a day °Return for worsening symptoms ° °

## 2017-04-21 ENCOUNTER — Other Ambulatory Visit: Payer: Self-pay | Admitting: Obstetrics & Gynecology

## 2017-06-25 ENCOUNTER — Other Ambulatory Visit: Payer: Self-pay | Admitting: Obstetrics & Gynecology

## 2017-09-23 ENCOUNTER — Emergency Department (HOSPITAL_COMMUNITY): Payer: Self-pay

## 2017-09-23 ENCOUNTER — Emergency Department (HOSPITAL_COMMUNITY)
Admission: EM | Admit: 2017-09-23 | Discharge: 2017-09-23 | Disposition: A | Discharge status: Home / Self Care | Payer: Self-pay | Attending: Emergency Medicine | Admitting: Emergency Medicine

## 2017-09-23 ENCOUNTER — Encounter (HOSPITAL_COMMUNITY): Payer: Self-pay | Admitting: Emergency Medicine

## 2017-09-23 ENCOUNTER — Other Ambulatory Visit: Payer: Self-pay

## 2017-09-23 DIAGNOSIS — M25561 Pain in right knee: Secondary | ICD-10-CM

## 2017-09-23 DIAGNOSIS — S8391XA Sprain of unspecified site of right knee, initial encounter: Secondary | ICD-10-CM | POA: Insufficient documentation

## 2017-09-23 DIAGNOSIS — F17210 Nicotine dependence, cigarettes, uncomplicated: Secondary | ICD-10-CM | POA: Insufficient documentation

## 2017-09-23 DIAGNOSIS — Y999 Unspecified external cause status: Secondary | ICD-10-CM | POA: Insufficient documentation

## 2017-09-23 DIAGNOSIS — J45909 Unspecified asthma, uncomplicated: Secondary | ICD-10-CM | POA: Insufficient documentation

## 2017-09-23 DIAGNOSIS — Z79899 Other long term (current) drug therapy: Secondary | ICD-10-CM | POA: Insufficient documentation

## 2017-09-23 DIAGNOSIS — Y92002 Bathroom of unspecified non-institutional (private) residence single-family (private) house as the place of occurrence of the external cause: Secondary | ICD-10-CM | POA: Insufficient documentation

## 2017-09-23 DIAGNOSIS — W1840XA Slipping, tripping and stumbling without falling, unspecified, initial encounter: Secondary | ICD-10-CM | POA: Insufficient documentation

## 2017-09-23 DIAGNOSIS — Y939 Activity, unspecified: Secondary | ICD-10-CM | POA: Insufficient documentation

## 2017-09-23 MED ORDER — HYDROCODONE-ACETAMINOPHEN 5-325 MG PO TABS
2.0000 | ORAL_TABLET | Freq: Once | ORAL | Status: AC
  Administered 2017-09-23: 2 via ORAL
  Filled 2017-09-23: qty 2

## 2017-09-23 MED ORDER — NAPROXEN 500 MG PO TABS: 500.0000 mg | ORAL_TABLET | Freq: Two times a day (BID) | ORAL | 0 refills | Status: AC

## 2017-09-23 NOTE — Discharge Instructions (Addendum)
1. Medications: alternate naprosyn and tylenol for pain control, usual home medications 2. Treatment: rest, ice, elevate and use brace and crutches, drink plenty of fluids, gentle stretching 3. Follow Up: Please followup with orthopedics in 1 week if no improvement for discussion of your diagnoses and further evaluation after today's visit; if you do not have a primary care doctor use the resource guide provided to find one; Please return to the ER for worsening symptoms or other concerns

## 2017-09-23 NOTE — ED Provider Notes (Signed)
[facility] EMERGENCY DEPARTMENT Provider Note   CSN: [id number] Arrival date & time: 09/23/17  0335     History   Chief Complaint Chief Complaint  Patient presents with  . Knee Pain    HPI [name] is a 32 y.o. female with a hx of asthma, chronic back pain, obesity presents to the Emergency Department complaining of acute, persistent, progressively worsening right knee onset 2:45am after slipping in the bathroom.  Pt reports she did not fall, but did twist her knee and heard it "crack."  She reports she was able to walk on it, but it is very painful and she feels like it is going to "give out."  Pt reports she works on her feet regularly.  Ambulation and palpation make the pain worse.  No treatments PTA.  Pt denies numbness, tingling, decreased sensation, discoloration.       The history is provided by the patient and medical records. No language interpreter was used.    Past Medical History:  Diagnosis Date  . Abnormal Pap smear    follow up ok  . Asthma   . Back pain    herniated disk  . Headache(784.0)   . Infection    Urinary tract infection  . Obesity   . Pregnancy induced hypertension     Patient Active Problem List   Diagnosis Date Noted  . Pregnancy 01/02/2014  . Gestational HTN 01/01/2014  . Status post vaginal delivery 01/15/2013    Past Surgical History:  Procedure Laterality Date  . TONSILECTOMY, ADENOIDECTOMY, BILATERAL MYRINGOTOMY AND TUBES    . TONSILLECTOMY    . TUBAL LIGATION Bilateral 01/16/2013   Procedure: POST PARTUM TUBAL LIGATION;  Surgeon: Adam Phenix, MD;  Location: [facility];  Service: Gynecology;  Laterality: Bilateral;  . TUBAL LIGATION  01/16/2013  . WISDOM TOOTH EXTRACTION       OB History    Gravida  5   Para  5   Term  5   Preterm      AB      Living  5     SAB      TAB      Ectopic      Multiple      Live Births  5            Home Medications    Prior to Admission  medications   Medication Sig Start Date End Date Taking? Authorizing Provider  EPINEPHrine (EPIPEN) 0.3 mg/0.3 mL SOAJ injection Inject 0.3 mg into the muscle daily as needed (Anaphylaxis).    [provider]  hydrochlorothiazide (HYDRODIURIL) 25 MG tablet TAKE 1 TABLET BY MOUTH EVERY DAY 06/26/17   Anyanwu, Jethro Bastos, MD  ibuprofen (ADVIL,MOTRIN) 600 MG tablet Take 1 tablet (600 mg total) by mouth every 6 (six) hours as needed for mild pain, moderate pain or cramping. Patient not taking: Reported on 09/10/2014 01/04/14   Cheral Marker, CNM  methocarbamol (ROBAXIN) 500 MG tablet Take 1 tablet (500 mg total) by mouth at bedtime and may repeat dose one time if needed. 01/27/17   Bethel Born, PA-C  naproxen (NAPROSYN) 500 MG tablet Take 1 tablet (500 mg total) by mouth 2 (two) times daily with a meal. 09/23/17   Trentin Knappenberger, Dahlia Client, PA-C  traMADol (ULTRAM) 50 MG tablet Take 1 tablet (50 mg total) by mouth every 6 (six) hours as needed. 09/10/14   Antony Madura, PA-C    Family History Family History  Problem Relation Age of Onset  . Hypertension Mother   . Diabetes Mother   . Other Neg Hx     Social History Social History   Tobacco Use  . Smoking status: Current Every Day Smoker    Types: E-cigarettes  . Smokeless tobacco: Never Used  . Tobacco comment: vapes  Substance Use Topics  . Alcohol use: Yes    Comment: social  . Drug use: No     Allergies   Iodine and Shellfish allergy   Review of Systems Review of Systems  Constitutional: Negative for chills and fever.  Gastrointestinal: Negative for nausea and vomiting.  Musculoskeletal: Positive for arthralgias and joint swelling. Negative for back pain, neck pain and neck stiffness.  Skin: Negative for wound.  Neurological: Negative for numbness.  Hematological: Does not bruise/bleed easily.  Psychiatric/Behavioral: The patient is not nervous/anxious.   All other systems reviewed and are negative.    Physical  Exam Updated Vital Signs BP (!) 106/59 (BP Location: Left Arm)   Pulse 96   Temp 98.3 F (36.8 C) (Oral)   Resp 16   LMP 09/16/2017   SpO2 100%   Physical Exam  Constitutional: She appears well-developed and well-nourished. No distress.  HENT:  Head: Normocephalic and atraumatic.  Eyes: Conjunctivae are normal.  Neck: Normal range of motion.  Cardiovascular: Normal rate, regular rhythm and intact distal pulses.  Capillary refill < 3 sec  Pulmonary/Chest: Effort normal and breath sounds normal.  Musculoskeletal: She exhibits tenderness. She exhibits no edema.  Right knee:  Almost full ROM.  No joint line tenderness.  No obvious swelling or joint effusion. Right ankle: FROM, no TTP, no swelling Right hip: FROM, no TTP  Neurological: She is alert. Coordination normal.  Sensation intact to normal touch throughout the RLE Strength 5/5 with dorsiflexion and plantarflexion.   Pt able to stand and take several steps without difficulty, but refuses to walk in the hallway due to concern that her knee will "give out."   Skin: Skin is warm and dry. She is not diaphoretic.  No tenting of the skin  Psychiatric: She has a normal mood and affect.  Nursing note and vitals reviewed.    ED Treatments / Results   Radiology Dg Knee Complete 4 Views Right  Result Date: 09/23/2017 CLINICAL DATA:  Slipped in water in the bathroom and heard knee crack. Right knee pain. EXAM: RIGHT KNEE - COMPLETE 4+ VIEW COMPARISON:  Radiograph 09/10/2014 FINDINGS: No evidence of fracture, dislocation, or joint effusion. No evidence of arthropathy or other focal bone abnormality. Soft tissues are unremarkable. IMPRESSION: Negative radiographs of the right knee. Electronically Signed   By: Rubye OaksMelanie  Ehinger M.D.   On: 09/23/2017 04:26    Procedures Procedures (including critical care time)  Medications Ordered in ED Medications  HYDROcodone-acetaminophen (NORCO/VICODIN) 5-325 MG per tablet 2 tablet (2 tablets  Oral Given 09/23/17 0539)     Initial Impression / Assessment and Plan / ED Course  I have reviewed the triage vital signs and the nursing notes.  Pertinent labs & imaging results that were available during my care of the patient were reviewed by me and considered in my medical decision making (see chart for details).     Patient X-Ray negative for obvious fracture or dislocation. Pain managed in ED. Pt advised to follow up with orthopedics if symptoms persist for possibility of missed fracture diagnosis. Patient given brace and crutches while in ED, conservative therapy recommended and discussed. Patient will be dc  home & is agreeable with above plan.  Final Clinical Impressions(s) / ED Diagnoses   Final diagnoses:  Sprain of right knee, unspecified ligament, initial encounter  Acute pain of right knee    ED Discharge Orders        Ordered    naproxen (NAPROSYN) 500 MG tablet  2 times daily with meals     09/23/17 0600       Taunja Brickner, Boyd Kerbs 09/23/17 0606    Azalia Bilis, MD 09/23/17 [phone]
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
## 2017-11-08 ENCOUNTER — Other Ambulatory Visit: Payer: Self-pay | Admitting: Obstetrics & Gynecology

## 2018-01-02 ENCOUNTER — Other Ambulatory Visit: Payer: Self-pay | Admitting: Obstetrics & Gynecology

## 2018-02-17 ENCOUNTER — Encounter (HOSPITAL_COMMUNITY): Payer: Self-pay | Admitting: Emergency Medicine

## 2018-02-17 ENCOUNTER — Other Ambulatory Visit: Payer: Self-pay

## 2018-02-17 ENCOUNTER — Emergency Department (HOSPITAL_BASED_OUTPATIENT_CLINIC_OR_DEPARTMENT_OTHER): Payer: Self-pay

## 2018-02-17 ENCOUNTER — Other Ambulatory Visit: Payer: Self-pay | Admitting: Obstetrics & Gynecology

## 2018-02-17 ENCOUNTER — Emergency Department (HOSPITAL_COMMUNITY)
Admission: EM | Admit: 2018-02-17 | Discharge: 2018-02-17 | Disposition: A | Discharge status: Home / Self Care | Payer: Self-pay | Attending: Emergency Medicine | Admitting: Emergency Medicine

## 2018-02-17 DIAGNOSIS — F17210 Nicotine dependence, cigarettes, uncomplicated: Secondary | ICD-10-CM | POA: Insufficient documentation

## 2018-02-17 DIAGNOSIS — I82622 Acute embolism and thrombosis of deep veins of left upper extremity: Secondary | ICD-10-CM | POA: Insufficient documentation

## 2018-02-17 DIAGNOSIS — M79609 Pain in unspecified limb: Secondary | ICD-10-CM

## 2018-02-17 DIAGNOSIS — M7989 Other specified soft tissue disorders: Secondary | ICD-10-CM

## 2018-02-17 DIAGNOSIS — Z79899 Other long term (current) drug therapy: Secondary | ICD-10-CM | POA: Insufficient documentation

## 2018-02-17 LAB — CBC
HEMATOCRIT: 38.5 % (ref 36.0–46.0)
Hemoglobin: 12.9 g/dL (ref 12.0–15.0)
MCH: 29.7 pg (ref 26.0–34.0)
MCHC: 33.5 g/dL (ref 30.0–36.0)
MCV: 88.7 fL (ref 78.0–100.0)
Platelets: 245 10*3/uL (ref 150–400)
RBC: 4.34 MIL/uL (ref 3.87–5.11)
RDW: 12 % (ref 11.5–15.5)
WBC: 6.4 10*3/uL (ref 4.0–10.5)

## 2018-02-17 LAB — I-STAT TROPONIN, ED
TROPONIN I, POC: 0 ng/mL (ref 0.00–0.08)
Troponin i, poc: 0 ng/mL (ref 0.00–0.08)

## 2018-02-17 LAB — I-STAT BETA HCG BLOOD, ED (MC, WL, AP ONLY)

## 2018-02-17 LAB — BASIC METABOLIC PANEL
Anion gap: 9 (ref 5–15)
BUN: 9 mg/dL (ref 6–20)
CALCIUM: 9.1 mg/dL (ref 8.9–10.3)
CO2: 25 mmol/L (ref 22–32)
Chloride: 106 mmol/L (ref 98–111)
Creatinine, Ser: 0.76 mg/dL (ref 0.44–1.00)
GFR calc Af Amer: >60 mL/min (ref >60)
Glucose, Bld: 95 mg/dL (ref 70–99)
POTASSIUM: 3.4 mmol/L — AB (ref 3.5–5.1)
Sodium: 140 mmol/L (ref 135–145)

## 2018-02-17 LAB — ANTITHROMBIN III: AntiThromb III Func: 94 % (ref 75–120)

## 2018-02-17 MED ORDER — APIXABAN 5 MG PO TABS
10.0000 mg | ORAL_TABLET | Freq: Once | ORAL | Status: AC
  Administered 2018-02-17: 10 mg via ORAL
  Filled 2018-02-17: qty 2

## 2018-02-17 MED ORDER — ELIQUIS 5 MG VTE STARTER PACK: ORAL_TABLET | ORAL | 0 refills | Status: DC

## 2018-02-17 MED ORDER — ELIQUIS 5 MG VTE STARTER PACK: ORAL_TABLET | ORAL | 0 refills | Status: AC

## 2018-02-17 NOTE — Progress Notes (Signed)
VASCULAR LAB PRELIMINARY  PRELIMINARY  PRELIMINARY  PRELIMINARY  Left upper extremity venous duplex completed.    Preliminary report:  There is acute DVT noted in the left brachial vein.    Called results to Dr. Abbey ChattersButler  [name], [facility]ANDACE, RVT 02/17/2018, 8:12 AM

## 2018-02-17 NOTE — ED Provider Notes (Signed)
[facility] EMERGENCY DEPARTMENT Provider Note   CSN: [id number] Arrival date & time: 02/17/18  0141     History   Chief Complaint Chief Complaint  Patient presents with  . Arm Pain    HPI [name] is a 32 y.o. female with history of obesity, asthma who presents with sudden onset left arm pain that began around 8 PM while the patient was watching TV.  She took aspirin at home without relief.  She reports the pain starts around her medial elbow and radiates up toward her neck.  She had a short time where it radiated to her upper chest. She denies any injury.  She reports tingling down her forearm and into her hand.  She denies any recent long trips, surgeries, known cancer, exogenous estrogen use, history of blood clot.  Patient denies any shortness of breath, abdominal pain, nausea, vomiting, fever.  HPI  Past Medical History:  Diagnosis Date  . Abnormal Pap smear    follow up ok  . Asthma   . Back pain    herniated disk  . Headache(784.0)   . Infection    Urinary tract infection  . Obesity   . Pregnancy induced hypertension     Patient Active Problem List   Diagnosis Date Noted  . Pregnancy 01/02/2014  . Gestational HTN 01/01/2014  . Status post vaginal delivery 01/15/2013    Past Surgical History:  Procedure Laterality Date  . TONSILECTOMY, ADENOIDECTOMY, BILATERAL MYRINGOTOMY AND TUBES    . TONSILLECTOMY    . TUBAL LIGATION Bilateral 01/16/2013   Procedure: POST PARTUM TUBAL LIGATION;  Surgeon: Adam Phenix, MD;  Location: [facility];  Service: Gynecology;  Laterality: Bilateral;  . TUBAL LIGATION  01/16/2013  . WISDOM TOOTH EXTRACTION       OB History    Gravida  5   Para  5   Term  5   Preterm      AB      Living  5     SAB      TAB      Ectopic      Multiple      Live Births  5            Home Medications    Prior to Admission medications   Medication Sig Start Date End Date Taking? Authorizing Provider   APPLE CIDER VINEGAR PO Take 450 mg by mouth daily.   Yes [provider]  EPINEPHrine (EPIPEN) 0.3 mg/0.3 mL SOAJ injection Inject 0.3 mg into the muscle daily as needed (Anaphylaxis).   Yes [provider]  Turmeric 500 MG CAPS Take 1 capsule by mouth daily.   Yes [provider]  vitamin B-12 (CYANOCOBALAMIN) 1000 MCG tablet Take 500 mcg by mouth daily.   Yes [provider]  ELIQUIS STARTER PACK (ELIQUIS STARTER PACK) 5 MG TABS Take as directed on package: start with two-5mg  tablets twice daily for 7 days. On day 8, switch to one-5mg  tablet twice daily. 02/17/18   Little Bashore, Waylan Boga, PA-C  hydrochlorothiazide (HYDRODIURIL) 25 MG tablet TAKE 1 TABLET BY MOUTH EVERY DAY Patient not taking: Reported on 02/17/2018 06/26/17   Anyanwu, Jethro Bastos, MD  ibuprofen (ADVIL,MOTRIN) 600 MG tablet Take 1 tablet (600 mg total) by mouth every 6 (six) hours as needed for mild pain, moderate pain or cramping. Patient not taking: Reported on 09/10/2014 01/04/14   Cheral Marker, CNM  methocarbamol (ROBAXIN) 500 MG tablet Take 1  tablet (500 mg total) by mouth at bedtime and may repeat dose one time if needed. Patient not taking: Reported on 02/17/2018 01/27/17   Bethel BornGekas, Kelly Marie, PA-C  naproxen (NAPROSYN) 500 MG tablet Take 1 tablet (500 mg total) by mouth 2 (two) times daily with a meal. Patient not taking: Reported on 02/17/2018 09/23/17   Muthersbaugh, Dahlia ClientHannah, PA-C  traMADol (ULTRAM) 50 MG tablet Take 1 tablet (50 mg total) by mouth every 6 (six) hours as needed. Patient not taking: Reported on 02/17/2018 09/10/14   Antony MaduraHumes, Kelly, PA-C    Family History Family History  Problem Relation Age of Onset  . Hypertension Mother   . Diabetes Mother   . Other Neg Hx     Social History Social History   Tobacco Use  . Smoking status: Current Every Day Smoker    Types: E-cigarettes  . Smokeless tobacco: Never Used  . Tobacco comment: vapes  Substance Use Topics  . Alcohol use: Yes      Comment: social  . Drug use: No     Allergies   Iodine and Shellfish allergy   Review of Systems Review of Systems  Constitutional: Negative for chills and fever.  HENT: Negative for facial swelling and sore throat.   Respiratory: Negative for shortness of breath.   Cardiovascular: Negative for chest pain.  Gastrointestinal: Negative for abdominal pain, nausea and vomiting.  Genitourinary: Negative for dysuria.  Musculoskeletal: Positive for arthralgias, myalgias and neck pain. Negative for back pain.  Skin: Negative for rash and wound.  Neurological: Negative for headaches.  Psychiatric/Behavioral: The patient is not nervous/anxious.      Physical Exam Updated Vital Signs BP (!) 136/96   Pulse 69   Temp 97.6 F (36.4 C) (Oral)   Resp 16   LMP 01/27/2018   SpO2 99%   Physical Exam  Constitutional: She appears well-developed and well-nourished. No distress.  HENT:  Head: Normocephalic and atraumatic.  Mouth/Throat: Oropharynx is clear and moist. No oropharyngeal exudate.  Eyes: Pupils are equal, round, and reactive to light. Conjunctivae are normal. Right eye exhibits no discharge. Left eye exhibits no discharge. No scleral icterus.  Neck: Normal range of motion. Neck supple. No thyromegaly present.    Cardiovascular: Normal rate, regular rhythm, normal heart sounds and intact distal pulses. Exam reveals no gallop and no friction rub.  No murmur heard. Pulmonary/Chest: Effort normal and breath sounds normal. No stridor. No respiratory distress. She has no wheezes. She has no rales.  Abdominal: Soft. Bowel sounds are normal. She exhibits no distension. There is no tenderness. There is no rebound and no guarding.  Musculoskeletal: She exhibits no edema.       Arms: Lymphadenopathy:    She has no cervical adenopathy.  Neurological: She is alert. Coordination normal.  Equal bilateral grip strength, 5/5 strength to bilateral upper extremities   Skin: Skin is warm  and dry. No rash noted. She is not diaphoretic. No pallor.  Psychiatric: She has a normal mood and affect.  Nursing note and vitals reviewed.    ED Treatments / Results  Labs (all labs ordered are listed, but only abnormal results are displayed) Labs Reviewed  BASIC METABOLIC PANEL - Abnormal; Notable for the following components:      Result Value   Potassium 3.4 (*)    All other components within normal limits  CBC  ANTITHROMBIN III  PROTEIN C ACTIVITY  PROTEIN C, TOTAL  PROTEIN S ACTIVITY  PROTEIN S, TOTAL  LUPUS ANTICOAGULANT PANEL  BETA-2-GLYCOPROTEIN I ABS, IGG/M/A  HOMOCYSTEINE  FACTOR 5 LEIDEN  PROTHROMBIN GENE MUTATION  CARDIOLIPIN ANTIBODIES, IGG, IGM, IGA  I-STAT TROPONIN, ED  I-STAT BETA HCG BLOOD, ED ([facility], [facility], [facility] ONLY)  I-STAT TROPONIN, ED    EKG EKG Interpretation  Date/Time:  Saturday February 17 2018 02:01:33 EDT Ventricular Rate:  74 PR Interval:  154 QRS Duration: 82 QT Interval:  368 QTC Calculation: 408 R Axis:   12 Text Interpretation:  Normal sinus rhythm with sinus arrhythmia Normal ECG no prior to compare with Confirmed by Meridee ScoreButler, Michael [phone]([id number]) on 02/17/2018 10:35:54 AM   Radiology No results found.  Procedures Procedures (including critical care time)  Medications Ordered in ED Medications  apixaban (ELIQUIS) tablet 10 mg (10 mg Oral Given 02/17/18 1108)     Initial Impression / Assessment and Plan / ED Course  I have reviewed the triage vital signs and the nursing notes.  Pertinent labs & imaging results that were available during my care of the patient were reviewed by me and considered in my medical decision making (see chart for details).     Patient presenting with sudden onset left arm pain while patient was sitting watching TV.  DVT ultrasound shows positive DVT in the left brachial vein.  Patient denies any chest pain or shortness of breath.  Feel patient is a good candidate for outpatient anticoagulation and Eliquis  initiated in the ED.  Patient advised to follow-up and establish care with a primary care provider and given several resources to do so.  Hypercoagulability panel ordered prior to discharge.  Basic labs unremarkable.  EKG shows NSR with sinus arrhythmia.  Patient understands and agrees with plan.  Strict return precautions discussed.  Patient vitals stable throughout ED course and discharged in satisfactory condition.  Final Clinical Impressions(s) / ED Diagnoses   Final diagnoses:  Acute deep vein thrombosis (DVT) of brachial vein of left upper extremity [facility](HCC)    ED Discharge Orders         Ordered    ELIQUIS STARTER PACK (ELIQUIS STARTER PACK) 5 MG TABS  Status:  Discontinued     02/17/18 1021    ELIQUIS STARTER PACK (ELIQUIS STARTER PACK) 5 MG TABS     02/17/18 1107           Emi HolesLaw, Aine Strycharz M, PA-C 02/17/18 1537    Gilda CreasePollina, Christopher J, MD 02/18/18 [phone]

## 2018-02-17 NOTE — Discharge Instructions (Addendum)
Please establish care with one of the primary care providers below as soon as possible. Please make them aware that you have a DVT and need follow up.  The hypercoagulability panel that was drawn today will be followed up by the primary care provider you see.  Begin taking Eliquis as prescribed.  Do not take Eliquis with NSAID medication such as ibuprofen, Aleve, or aspirin.  You can take Tylenol as prescribed over-the-counter, as needed for your pain.  You will need to have further prescriptions prescribed by primary care provider.  Please return to emergency department if you develop any new or worsening symptoms including worsening pain, chest pain, shortness of breath, lightheadedness, racing of your heart, or any other new concerning symptoms.   Information on my medicine - ELIQUIS (apixaban)  Why was Eliquis prescribed for you? Eliquis was prescribed to treat blood clots that may have been found in the veins of your legs (deep vein thrombosis) or in your lungs (pulmonary embolism) and to reduce the risk of them occurring again.  What do You need to know about Eliquis ? The starting dose is 10 mg (two 5 mg tablets) taken TWICE daily for the FIRST SEVEN (7) DAYS, then on 8/24 the dose is reduced to ONE 5 mg tablet taken TWICE daily.  Eliquis may be taken with or without food.   Try to take the dose about the same time in the morning and in the evening. If you have difficulty swallowing the tablet whole please discuss with your pharmacist how to take the medication safely.  Take Eliquis exactly as prescribed and DO NOT stop taking Eliquis without talking to the doctor who prescribed the medication.  Stopping may increase your risk of developing a new blood clot.  Refill your prescription before you run out.  After discharge, you should have regular check-up appointments with your healthcare provider that is prescribing your Eliquis.    What do you do if you miss a dose? If a dose of  ELIQUIS is not taken at the scheduled time, take it as soon as possible on the same day and twice-daily administration should be resumed. The dose should not be doubled to make up for a missed dose.  Important Safety Information A possible side effect of Eliquis is bleeding. You should call your healthcare provider right away if you experience any of the following: ? Bleeding from an injury or your nose that does not stop. ? Unusual colored urine (red or dark brown) or unusual colored stools (red or black). ? Unusual bruising for unknown reasons. ? A serious fall or if you hit your head (even if there is no bleeding).  Some medicines may interact with Eliquis and might increase your risk of bleeding or clotting while on Eliquis. To help avoid this, consult your healthcare provider or pharmacist prior to using any new prescription or non-prescription medications, including herbals, vitamins, non-steroidal anti-inflammatory drugs (NSAIDs) and supplements.  This website has more information on Eliquis (apixaban): http://www.eliquis.com/eliquis/home

## 2018-02-17 NOTE — ED Notes (Signed)
Pharmacy called about Eliquis

## 2018-02-17 NOTE — ED Triage Notes (Signed)
C/o L upper arm pain since 8pm and tingling in L hand.  History of high BP while pregnant but was taken off BP meds 2-3 months ago.

## 2018-02-19 LAB — LUPUS ANTICOAGULANT PANEL
DRVVT: 32.1 s (ref 0.0–47.0)
PTT Lupus Anticoagulant: 36.5 s (ref 0.0–51.9)

## 2018-02-19 LAB — PROTEIN C ACTIVITY: Protein C Activity: 105 % (ref 73–180)

## 2018-02-19 LAB — PROTEIN S, TOTAL: Protein S Ag, Total: 73 % (ref 60–150)

## 2018-02-19 LAB — HOMOCYSTEINE: Homocysteine: 8.7 umol/L (ref 0.0–15.0)

## 2018-02-19 LAB — PROTEIN S ACTIVITY: Protein S Activity: 83 % (ref 63–140)

## 2018-02-20 LAB — CARDIOLIPIN ANTIBODIES, IGG, IGM, IGA
Anticardiolipin IgA: <9 APL U/mL (ref 0–11)
Anticardiolipin IgG: <9 GPL U/mL (ref 0–14)
Anticardiolipin IgM: 12 MPL U/mL (ref 0–12)

## 2018-02-20 LAB — BETA-2-GLYCOPROTEIN I ABS, IGG/M/A
Beta-2 Glyco I IgG: <9 GPI IgG units (ref 0–20)
Beta-2-Glycoprotein I IgA: <9 GPI IgA units (ref 0–25)
Beta-2-Glycoprotein I IgM: <9 GPI IgM units (ref 0–32)

## 2018-02-20 LAB — PROTEIN C, TOTAL: Protein C, Total: 112 % (ref 60–150)

## 2018-02-22 LAB — FACTOR 5 LEIDEN

## 2018-02-22 LAB — PROTHROMBIN GENE MUTATION

## 2018-06-22 ENCOUNTER — Other Ambulatory Visit (HOSPITAL_COMMUNITY): Payer: Self-pay | Admitting: Family Medicine

## 2018-06-22 DIAGNOSIS — Z09 Encounter for follow-up examination after completed treatment for conditions other than malignant neoplasm: Secondary | ICD-10-CM

## 2018-07-02 ENCOUNTER — Ambulatory Visit (HOSPITAL_COMMUNITY)
Admission: RE | Admit: 2018-07-02 | Discharge: 2018-07-02 | Disposition: A | Discharge status: Home / Self Care | Payer: Self-pay | Source: Ambulatory Visit | Attending: Family Medicine | Admitting: Family Medicine

## 2018-07-02 DIAGNOSIS — Z86718 Personal history of other venous thrombosis and embolism: Secondary | ICD-10-CM | POA: Insufficient documentation

## 2018-07-02 DIAGNOSIS — Z09 Encounter for follow-up examination after completed treatment for conditions other than malignant neoplasm: Secondary | ICD-10-CM | POA: Insufficient documentation

## 2018-07-02 NOTE — Progress Notes (Signed)
Left upper extremity venous duplex has been completed. Negative for DVT. Results were faxed to the ordering physician.  07/02/18 10:37 AM Olen CordialGreg Lakeysha Slutsky RVT

## 2018-07-11 ENCOUNTER — Other Ambulatory Visit (HOSPITAL_COMMUNITY): Payer: Self-pay | Admitting: Internal Medicine

## 2018-07-11 DIAGNOSIS — R079 Chest pain, unspecified: Secondary | ICD-10-CM

## 2018-07-12 ENCOUNTER — Other Ambulatory Visit (HOSPITAL_COMMUNITY): Payer: Medicaid Other

## 2018-07-12 ENCOUNTER — Ambulatory Visit (HOSPITAL_COMMUNITY)
Admission: RE | Admit: 2018-07-12 | Discharge: 2018-07-12 | Disposition: A | Discharge status: Home / Self Care | Payer: Medicaid Other | Source: Ambulatory Visit | Attending: Internal Medicine | Admitting: Internal Medicine

## 2018-07-12 ENCOUNTER — Encounter (HOSPITAL_COMMUNITY): Payer: Self-pay | Admitting: Family Medicine

## 2018-07-12 DIAGNOSIS — R079 Chest pain, unspecified: Secondary | ICD-10-CM | POA: Insufficient documentation

## 2021-02-17 ENCOUNTER — Ambulatory Visit: Payer: Medicaid Other | Admitting: Obstetrics and Gynecology

## 2021-03-15 ENCOUNTER — Ambulatory Visit: Payer: Medicaid Other | Admitting: Obstetrics

## 2021-03-22 ENCOUNTER — Encounter: Payer: Self-pay | Admitting: Obstetrics

## 2021-03-22 ENCOUNTER — Other Ambulatory Visit: Payer: Self-pay

## 2021-03-22 ENCOUNTER — Ambulatory Visit (INDEPENDENT_AMBULATORY_CARE_PROVIDER_SITE_OTHER): Payer: Medicaid Other | Admitting: Obstetrics

## 2021-03-22 ENCOUNTER — Other Ambulatory Visit (HOSPITAL_COMMUNITY)
Admission: RE | Admit: 2021-03-22 | Discharge: 2021-03-22 | Disposition: A | Discharge status: Home / Self Care | Payer: Medicaid Other | Source: Ambulatory Visit | Attending: Obstetrics | Admitting: Obstetrics

## 2021-03-22 VITALS — BP 126/89 | HR 75 | Ht 64.0 in | Wt 265.0 lb

## 2021-03-22 DIAGNOSIS — N898 Other specified noninflammatory disorders of vagina: Secondary | ICD-10-CM | POA: Diagnosis present

## 2021-03-22 DIAGNOSIS — Z0000 Encounter for general adult medical examination without abnormal findings: Secondary | ICD-10-CM

## 2021-03-22 DIAGNOSIS — Z01419 Encounter for gynecological examination (general) (routine) without abnormal findings: Secondary | ICD-10-CM | POA: Insufficient documentation

## 2021-03-22 DIAGNOSIS — Z6841 Body Mass Index (BMI) 40.0 and over, adult: Secondary | ICD-10-CM | POA: Diagnosis not present

## 2021-03-22 DIAGNOSIS — F17210 Nicotine dependence, cigarettes, uncomplicated: Secondary | ICD-10-CM | POA: Diagnosis not present

## 2021-03-22 NOTE — Progress Notes (Signed)
New patient is in the office to establish care. LMP 03-14-21, reports painful menstrual cycles 10/10 affecting her ADLs. Pt declines BC today Pt reports that last pap in April at [facility], advised pt to sign records release.

## 2021-03-22 NOTE — Progress Notes (Signed)
Subjective:        [name] is a 35 y.o. female here for a routine exam.  Current complaints: Vaginal discharge.    Personal health questionnaire:  Is patient Ashkenazi Jewish, have a family history of breast and/or ovarian cancer: no Is there a family history of uterine cancer diagnosed at age < 15, gastrointestinal cancer, urinary tract cancer, family member who is a Personnel officer syndrome-associated carrier: no Is the patient overweight and hypertensive, family history of diabetes, personal history of gestational diabetes, preeclampsia or PCOS: no Is patient over 41, have PCOS,  family history of premature CHD under age 101, diabetes, smoke, have hypertension or peripheral artery disease:  no At any time, has a partner hit, kicked or otherwise hurt or frightened you?: no Over the past 2 weeks, have you felt down, depressed or hopeless?: no Over the past 2 weeks, have you felt little interest or pleasure in doing things?:no   Gynecologic History Patient's last menstrual period was 03/14/2021. Contraception: tubal ligation Last Pap: unknown. Results were: normal Last mammogram: n/a. Results were: n/a  Obstetric History OB History  Gravida Para Term Preterm AB Living  5 5 5     5   SAB IAB Ectopic Multiple Live Births          5    # Outcome Date GA Lbr Len/2nd Weight Sex Delivery Anes PTL Lv  5 Term 01/02/14 [redacted]w[redacted]d 05:00 / 00:10 7 lb 8.5 oz (3.416 kg) M Vag-Spont EPI  LIV  4 Term 01/15/13 [redacted]w[redacted]d 07:51 / 00:06 7 lb 13.6 oz (3.561 kg) M Vag-Spont EPI  LIV  3 Term     M Vag-Spont  N LIV     Birth Comments: PIH  2 Term     F Vag-Spont  N LIV  1 Term     M Vag-Spont  N LIV    Past Medical History:  Diagnosis Date   Abnormal Pap smear    follow up ok   Asthma    Back pain    herniated disk   Headache(784.0)    Infection    Urinary tract infection   Obesity    Pregnancy induced hypertension     Past Surgical History:  Procedure Laterality Date   TONSILECTOMY,  ADENOIDECTOMY, BILATERAL MYRINGOTOMY AND TUBES     TONSILLECTOMY     TUBAL LIGATION Bilateral 01/16/2013   Procedure: POST PARTUM TUBAL LIGATION;  Surgeon: 01/18/2013, MD;  Location: [facility];  Service: Gynecology;  Laterality: Bilateral;   TUBAL LIGATION  01/16/2013   WISDOM TOOTH EXTRACTION       Current Outpatient Medications:    amlodipine-atorvastatin (CADUET) 10-10 MG tablet, Take 1 tablet by mouth daily., Disp: , Rfl:    naproxen (NAPROSYN) 500 MG tablet, Take 1 tablet (500 mg total) by mouth 2 (two) times daily with a meal., Disp: 30 tablet, Rfl: 0   APPLE CIDER VINEGAR PO, Take 450 mg by mouth daily. (Patient not taking: Reported on 03/22/2021), Disp: , Rfl:    ELIQUIS STARTER PACK (ELIQUIS STARTER PACK) 5 MG TABS, Take as directed on package: start with two-5mg  tablets twice daily for 7 days. On day 8, switch to one-5mg  tablet twice daily. (Patient not taking: Reported on 03/22/2021), Disp: 1 each, Rfl: 0   EPINEPHrine 0.3 mg/0.3 mL IJ SOAJ injection, Inject 0.3 mg into the muscle daily as needed (Anaphylaxis). (Patient not taking: Reported on 03/22/2021), Disp: , Rfl:    hydrochlorothiazide (HYDRODIURIL) 25 MG tablet,  TAKE 1 TABLET BY MOUTH EVERY DAY (Patient not taking: No sig reported), Disp: 30 tablet, Rfl: 3   ibuprofen (ADVIL,MOTRIN) 600 MG tablet, Take 1 tablet (600 mg total) by mouth every 6 (six) hours as needed for mild pain, moderate pain or cramping. (Patient not taking: No sig reported), Disp: 30 tablet, Rfl: 0   methocarbamol (ROBAXIN) 500 MG tablet, Take 1 tablet (500 mg total) by mouth at bedtime and may repeat dose one time if needed. (Patient not taking: No sig reported), Disp: 10 tablet, Rfl: 0   traMADol (ULTRAM) 50 MG tablet, Take 1 tablet (50 mg total) by mouth every 6 (six) hours as needed. (Patient not taking: No sig reported), Disp: 15 tablet, Rfl: 0   Turmeric 500 MG CAPS, Take 1 capsule by mouth daily. (Patient not taking: Reported on 03/22/2021), Disp: , Rfl:     vitamin B-12 (CYANOCOBALAMIN) 1000 MCG tablet, Take 500 mcg by mouth daily. (Patient not taking: Reported on 03/22/2021), Disp: , Rfl:  Allergies  Allergen Reactions   Iodine Anaphylaxis   Shellfish Allergy Anaphylaxis    Social History   Tobacco Use   Smoking status: Former    Types: E-cigarettes    Start date: 2017   Smokeless tobacco: Never   Tobacco comments:    vapes  Substance Use Topics   Alcohol use: Not Currently    Comment: social    Family History  Problem Relation Age of Onset   Hypertension Mother    Diabetes Mother    Other Neg Hx       Review of Systems  Constitutional: negative for fatigue and weight loss Respiratory: negative for cough and wheezing Cardiovascular: negative for chest pain, fatigue and palpitations Gastrointestinal: negative for abdominal pain and change in bowel habits Musculoskeletal:negative for myalgias Neurological: negative for gait problems and tremors Behavioral/Psych: negative for abusive relationship, depression Endocrine: negative for temperature intolerance    Genitourinary: positive for vaginal discharge.  negative for abnormal menstrual periods, genital lesions, hot flashes, sexual problems  Integument/breast: negative for breast lump, breast tenderness, nipple discharge and skin lesion(s)    Objective:       BP 126/89   Pulse 75   Ht 5\' 4"  (1.626 m)   Wt 265 lb (120.2 kg)   LMP 03/14/2021   BMI 45.49 kg/m  General:   Alert and no distress  Skin:   no rash or abnormalities  Lungs:   clear to auscultation bilaterally  Heart:   regular rate and rhythm, S1, S2 normal, no murmur, click, rub or gallop  Breasts:   normal without suspicious masses, skin or nipple changes or axillary nodes  Abdomen:  normal findings: no organomegaly, soft, non-tender and no hernia  Pelvis:  External genitalia: normal general appearance Urinary system: urethral meatus normal and bladder without fullness, nontender Vaginal: normal without  tenderness, induration or masses Cervix: normal appearance Adnexa: normal bimanual exam Uterus: anteverted and non-tender, normal size   Lab Review Urine pregnancy test Labs reviewed yes Radiologic studies reviewed yes  I have spent a total of 20 minutes of face-to-face time, excluding clinical staff time, reviewing notes and preparing to see patient, ordering tests and/or medications, and counseling the patient.   Assessment:     1. Encounter for annual routine gynecological examination  2. Tobacco dependence due to cigarettes - cessation with the aid of medication and behavioral modification recommended  3. Routine adult health maintenance  4. Class 3 severe obesity due to excess calories without serious  comorbidity with body mass index (BMI) of 45.0 to 49.9 in adult (HCC) - weight reduction with the aid of dietary changes, exercise and behavioral modification recommended   Plan:    Education reviewed: calcium supplements, depression evaluation, low fat, low cholesterol diet, safe sex/STD prevention, self breast exams, and weight bearing exercise. Contraception: none. Follow up in: 1 year.     Brock Bad, MD 03/22/2021 7:34 PM

## 2021-03-23 LAB — CERVICOVAGINAL ANCILLARY ONLY
Bacterial Vaginitis (gardnerella): NEGATIVE
Candida Glabrata: NEGATIVE
Candida Vaginitis: NEGATIVE
Chlamydia: NEGATIVE
Comment: NEGATIVE
Comment: NEGATIVE
Comment: NEGATIVE
Comment: NEGATIVE
Comment: NEGATIVE
Comment: NORMAL
Neisseria Gonorrhea: NEGATIVE
Trichomonas: NEGATIVE

## 2021-03-25 LAB — CYTOLOGY - PAP
Comment: NEGATIVE
Diagnosis: NEGATIVE
Diagnosis: REACTIVE
High risk HPV: NEGATIVE

## 2021-07-27 ENCOUNTER — Other Ambulatory Visit: Payer: Self-pay | Admitting: Family Medicine

## 2021-07-30 ENCOUNTER — Other Ambulatory Visit: Payer: Self-pay | Admitting: *Deleted

## 2021-07-30 DIAGNOSIS — N644 Mastodynia: Secondary | ICD-10-CM

## 2021-08-12 ENCOUNTER — Ambulatory Visit
Admission: RE | Admit: 2021-08-12 | Discharge: 2021-08-12 | Disposition: A | Discharge status: Home / Self Care | Payer: Medicaid Other | Source: Ambulatory Visit | Attending: *Deleted | Admitting: *Deleted

## 2021-08-12 ENCOUNTER — Other Ambulatory Visit: Payer: Self-pay | Admitting: *Deleted

## 2021-08-12 DIAGNOSIS — N6310 Unspecified lump in the right breast, unspecified quadrant: Secondary | ICD-10-CM

## 2021-08-12 DIAGNOSIS — N644 Mastodynia: Secondary | ICD-10-CM

## 2021-08-17 ENCOUNTER — Other Ambulatory Visit: Payer: Medicaid Other

## 2024-03-28 ENCOUNTER — Other Ambulatory Visit: Payer: Self-pay

## 2024-03-28 ENCOUNTER — Emergency Department (HOSPITAL_BASED_OUTPATIENT_CLINIC_OR_DEPARTMENT_OTHER)
Admission: EM | Admit: 2024-03-28 | Discharge: 2024-03-29 | Disposition: A | Discharge status: Home / Self Care | Attending: Emergency Medicine | Admitting: Emergency Medicine

## 2024-03-28 ENCOUNTER — Emergency Department (HOSPITAL_BASED_OUTPATIENT_CLINIC_OR_DEPARTMENT_OTHER): Admitting: Radiology

## 2024-03-28 DIAGNOSIS — R0789 Other chest pain: Secondary | ICD-10-CM

## 2024-03-28 DIAGNOSIS — Z7901 Long term (current) use of anticoagulants: Secondary | ICD-10-CM | POA: Diagnosis not present

## 2024-03-28 DIAGNOSIS — M79602 Pain in left arm: Secondary | ICD-10-CM | POA: Insufficient documentation

## 2024-03-28 DIAGNOSIS — R079 Chest pain, unspecified: Secondary | ICD-10-CM | POA: Insufficient documentation

## 2024-03-28 LAB — BASIC METABOLIC PANEL WITH GFR
Anion gap: 14 (ref 5–15)
BUN: 15 mg/dL (ref 6–20)
CO2: 24 mmol/L (ref 22–32)
Calcium: 9.6 mg/dL (ref 8.9–10.3)
Chloride: 102 mmol/L (ref 98–111)
Creatinine, Ser: 1 mg/dL (ref 0.44–1.00)
GFR, Estimated: >60 mL/min (ref >60)
Glucose, Bld: 92 mg/dL (ref 70–99)
Potassium: 3.2 mmol/L — ABNORMAL LOW (ref 3.5–5.1)
Sodium: 139 mmol/L (ref 135–145)

## 2024-03-28 LAB — CBC
HCT: 39.9 % (ref 36.0–46.0)
Hemoglobin: 13.6 g/dL (ref 12.0–15.0)
MCH: 30.2 pg (ref 26.0–34.0)
MCHC: 34.1 g/dL (ref 30.0–36.0)
MCV: 88.5 fL (ref 80.0–100.0)
Platelets: 236 K/uL (ref 150–400)
RBC: 4.51 MIL/uL (ref 3.87–5.11)
RDW: 12.6 % (ref 11.5–15.5)
WBC: 7.8 K/uL (ref 4.0–10.5)
nRBC: 0 % (ref 0.0–0.2)

## 2024-03-28 LAB — TROPONIN T, HIGH SENSITIVITY
Troponin T High Sensitivity: <15 ng/L (ref 0–19)
Troponin T High Sensitivity: <15 ng/L (ref 0–19)

## 2024-03-28 MED ORDER — SODIUM CHLORIDE 0.9 % IV BOLUS
1000.0000 mL | Freq: Once | INTRAVENOUS | Status: AC
  Administered 2024-03-28: 1000 mL via INTRAVENOUS

## 2024-03-28 NOTE — ED Triage Notes (Signed)
 Pt POV reporting pain in L arm and leg, hx DVT in L arm, US  done today, following US  pt began having L side chest pain, denies SOB, denies strenuous activity.

## 2024-03-28 NOTE — ED Provider Notes (Signed)
 [place] [facility] AT [facility] Provider Note   CSN: [id number] Arrival date & time: 03/28/24  1840     Patient presents with: Chest Pain   [name] is a 38 y.o. female.  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  Patient presenting today with complaints of pain in her left arm, left leg, and chest.  She was seen at an outside facility earlier today where an ultrasound was performed of her left arm.  This was negative for DVT.  After arriving home, she began having discomfort to the left upper chest.  She describes a sharp pain that comes and goes that is not associated with any shortness of breath, nausea, or diaphoresis.  She denies any fevers, chills, or cough.       Prior to Admission medications   Medication Sig Start Date End Date Taking? Authorizing Provider  amlodipine-atorvastatin (CADUET) 10-10 MG tablet Take 1 tablet by mouth daily.    [provider]  APPLE CIDER VINEGAR PO Take 450 mg by mouth daily. Patient not taking: Reported on 03/22/2021    [provider]  ELIQUIS  STARTER PACK (ELIQUIS  STARTER PACK) 5 MG TABS Take as directed on package: start with two-5mg  tablets twice daily for 7 days. On day 8, switch to one-5mg  tablet twice daily. Patient not taking: Reported on 03/22/2021 02/17/18   Rendell Lorane HERO, PA-C  EPINEPHrine  0.3 mg/0.3 mL IJ SOAJ injection Inject 0.3 mg into the muscle daily as needed (Anaphylaxis). Patient not taking: Reported on 03/22/2021    [provider]  hydrochlorothiazide  (HYDRODIURIL ) 25 MG tablet TAKE 1 TABLET BY MOUTH EVERY DAY Patient not taking: No sig reported 06/26/17   Anyanwu, Ugonna A, MD  ibuprofen  (ADVIL ,MOTRIN ) 600 MG tablet Take 1 tablet (600 mg total) by mouth every 6 (six) hours as needed for mild pain, moderate pain or  cramping. Patient not taking: No sig reported 01/04/14   Kizzie Suzen SAUNDERS, CNM  methocarbamol  (ROBAXIN ) 500 MG tablet Take 1 tablet (500 mg total) by mouth at bedtime and may repeat dose one time if needed. Patient not taking: No sig reported 01/27/17   Odis Burnard Jansky, PA-C  naproxen  (NAPROSYN ) 500 MG tablet Take 1 tablet (500 mg total) by mouth 2 (two) times daily with a meal. 09/23/17   Muthersbaugh, Chiquita, PA-C  traMADol  (ULTRAM ) 50 MG tablet Take 1 tablet (50 mg total) by mouth every 6 (six) hours as needed. Patient not taking: No sig reported 09/10/14   Keith Burnard, PA-C  Turmeric 500 MG CAPS Take 1 capsule by mouth daily. Patient not taking: Reported on 03/22/2021    [provider]  vitamin B-12 (CYANOCOBALAMIN) 1000 MCG tablet Take 500 mcg by mouth daily. Patient not taking: Reported on 03/22/2021    [provider]    Allergies: Iodine and Shellfish allergy    Review of Systems  All other systems reviewed and are negative.   Updated Vital Signs BP (!) 126/90 (BP Location: Right Arm)   Pulse 76   Temp (!) 97.5 F (36.4 C)   Resp 17   Ht 5' 3 (1.6 m)   Wt 127.9 kg   SpO2 100%   BMI 49.95 kg/m   Physical Exam Vitals and nursing note reviewed.  Constitutional:      General: She is not in acute distress.    Appearance: She is well-developed.  She is not diaphoretic.  HENT:     Head: Normocephalic and atraumatic.  Cardiovascular:     Rate and Rhythm: Normal rate and regular rhythm.     Heart sounds: No murmur heard.    No friction rub. No gallop.  Pulmonary:     Effort: Pulmonary effort is normal. No respiratory distress.     Breath sounds: Normal breath sounds. No wheezing.  Abdominal:     General: Bowel sounds are normal. There is no distension.     Palpations: Abdomen is soft.     Tenderness: There is no abdominal tenderness.  Musculoskeletal:        General: Normal range of motion.     Cervical back: Normal range of motion and neck supple.      Right lower leg: No tenderness. No edema.     Left lower leg: No tenderness. No edema.  Skin:    General: Skin is warm and dry.  Neurological:     General: No focal deficit present.     Mental Status: She is alert and oriented to person, place, and time.     (all labs ordered are listed, but only abnormal results are displayed) Labs Reviewed  BASIC METABOLIC PANEL WITH GFR - Abnormal; Notable for the following components:      Result Value   Potassium 3.2 (*)    All other components within normal limits  CBC  PREGNANCY, URINE  TROPONIN T, HIGH SENSITIVITY  TROPONIN T, HIGH SENSITIVITY    EKG: EKG Interpretation Date/Time:  Thursday March 28 2024 19:00:30 EDT Ventricular Rate:  75 PR Interval:  164 QRS Duration:  89 QT Interval:  374 QTC Calculation: 418 R Axis:   -9  Text Interpretation: Sinus rhythm Low voltage, precordial leads Confirmed by Geroldine Berg ([id number]) on 03/28/2024 11:03:11 PM  Radiology: [facility] Chest 2 View Result Date: 03/28/2024 CLINICAL DATA:  Chest pain EXAM: DG CHEST 2V COMPARISON:  04/22/2013 FINDINGS: The heart size and mediastinal contours are within normal limits. Both lungs are clear. The visualized skeletal structures are unremarkable. IMPRESSION: No active cardiopulmonary disease. Electronically Signed   By: Franky Crease M.D.   On: 03/28/2024 19:27    {Document cardiac monitor, telemetry assessment procedure when appropriate:32947} Procedures   Medications Ordered in the ED  sodium chloride  0.9 % bolus 1,000 mL (has no administration in time range)      {Click here for ABCD2, HEART and other calculators REFRESH Note before signing:1}                              Medical Decision Making Amount and/or Complexity of Data Reviewed Labs: ordered. Radiology: ordered.   ***  {Document critical care time when appropriate  Document review of labs and clinical decision tools ie CHADS2VASC2, etc  Document your independent review of  radiology images and any outside records  Document your discussion with family members, caretakers and with consultants  Document social determinants of health affecting pt's care  Document your decision making why or why not admission, treatments were needed:32947:::1}   Final diagnoses:  None    ED Discharge Orders     None

## 2024-03-29 ENCOUNTER — Emergency Department (HOSPITAL_BASED_OUTPATIENT_CLINIC_OR_DEPARTMENT_OTHER)

## 2024-03-29 LAB — PREGNANCY, URINE: Preg Test, Ur: NEGATIVE

## 2024-03-29 MED ORDER — METHYLPREDNISOLONE SODIUM SUCC 40 MG IJ SOLR
40.0000 mg | Freq: Once | INTRAMUSCULAR | Status: AC
  Administered 2024-03-29: 40 mg via INTRAVENOUS
  Filled 2024-03-29: qty 1

## 2024-03-29 MED ORDER — DIPHENHYDRAMINE HCL 50 MG/ML IJ SOLN
50.0000 mg | Freq: Once | INTRAMUSCULAR | Status: AC
Start: 2024-03-29 — End: 2024-03-29
  Administered 2024-03-29: 50 mg via INTRAVENOUS
  Filled 2024-03-29: qty 1

## 2024-03-29 MED ORDER — IOHEXOL 350 MG/ML SOLN
75.0000 mL | Freq: Once | INTRAVENOUS | Status: AC | PRN
  Administered 2024-03-29: 75 mL via INTRAVENOUS

## 2024-03-29 NOTE — Discharge Instructions (Signed)
 Take ibuprofen 600 mg every 6 hours as needed for pain.  Return to the emergency department if your symptoms significantly worsen or change.
